# Patient Record
Sex: Female | Born: 1950 | Race: White | Hispanic: No | Marital: Married | Smoking: Never smoker
Health system: Southern US, Community
[De-identification: ages and names within clinical notes are randomized; demographics above are authoritative.]

## PROBLEM LIST (undated history)

## (undated) DIAGNOSIS — Z973 Presence of spectacles and contact lenses: Secondary | ICD-10-CM

## (undated) DIAGNOSIS — E785 Hyperlipidemia, unspecified: Secondary | ICD-10-CM

## (undated) HISTORY — PX: BELPHAROPTOSIS REPAIR: SHX369

## (undated) HISTORY — PX: TRIGGER FINGER RELEASE: SHX641

## (undated) HISTORY — PX: BREAST BIOPSY: SHX20

## (undated) HISTORY — PX: COLONOSCOPY: SHX174

## (undated) HISTORY — PX: ABDOMINOPLASTY: SUR9

---

## 1994-09-29 HISTORY — PX: CARPAL TUNNEL RELEASE: SHX101

## 2008-09-29 HISTORY — PX: ABDOMINAL HYSTERECTOMY: SHX81

## 2008-09-29 HISTORY — PX: BLADDER SUSPENSION: SHX72

## 2017-08-11 ENCOUNTER — Encounter: Payer: Self-pay | Admitting: *Deleted

## 2017-08-11 NOTE — Discharge Instructions (Signed)
[facility] °[facility] ° °POST OPERATIVE INSTRUCTIONS FOR DR. TROXLER AND DR. FOWLER °[facility] ° ° °1. Take your medication as prescribed.  Pain medication should be taken only as needed. ° °2. Keep the dressing clean, dry and intact. ° °3. Keep your foot elevated above the heart level for the first 48 hours. ° °4. Walking to the bathroom and brief periods of walking are acceptable, unless we have instructed you to be non-weight bearing. ° °5. Always wear your post-op shoe when walking.  Always use your crutches if you are to be non-weight bearing. ° °6. Do not take a shower. Baths are permissible as long as the foot is kept out of the water.  ° °7. Every hour you are awake:  °- Bend your knee 15 times. °- Flex foot 15 times °- Massage calf 15 times ° °8. Call [facility] ([phone]) if any of the following problems occur: °- You develop a temperature or fever. °- The bandage becomes saturated with blood. °- Medication does not stop your pain. °- Injury of the foot occurs. °- Any symptoms of infection including redness, odor, or red streaks running from wound. ° ° °General Anesthesia, Adult, Care After °These instructions provide you with information about caring for yourself after your procedure. Your health care provider may also give you more specific instructions. Your treatment has been planned according to current medical practices, but problems sometimes occur. Call your health care provider if you have any problems or questions after your procedure. °What can I expect after the procedure? °After the procedure, it is common to have: °· Vomiting. °· A sore throat. °· Mental slowness. ° °It is common to feel: °· Nauseous. °· Cold or shivery. °· Sleepy. °· Tired. °· Sore or achy, even in parts of your body where you did not have surgery. ° °Follow these instructions at home: °For at least 24 hours after the procedure: °· Do not: °? Participate in  activities where you could fall or become injured. °? Drive. °? Use heavy machinery. °? Drink alcohol. °? Take sleeping pills or medicines that cause drowsiness. °? Make important decisions or sign legal documents. °? Take care of children on your own. °· Rest. °Eating and drinking °· If you vomit, drink water, juice, or soup when you can drink without vomiting. °· Drink enough fluid to keep your urine clear or pale yellow. °· Make sure you have little or no nausea before eating solid foods. °· Follow the diet recommended by your health care provider. °General instructions °· Have a responsible adult stay with you until you are awake and alert. °· Return to your normal activities as told by your health care provider. Ask your health care provider what activities are safe for you. °· Take over-the-counter and prescription medicines only as told by your health care provider. °· If you smoke, do not smoke without supervision. °· Keep all follow-up visits as told by your health care provider. This is important. °Contact a health care provider if: °· You continue to have nausea or vomiting at home, and medicines are not helpful. °· You cannot drink fluids or start eating again. °· You cannot urinate after 8-12 hours. °· You develop a skin rash. °· You have fever. °· You have increasing redness at the site of your procedure. °Get help right away if: °· You have difficulty breathing. °· You have chest pain. °· You have unexpected bleeding. °· You feel that you   are having a life-threatening or urgent problem. °This information is not intended to replace advice given to you by your health care provider. Make sure you discuss any questions you have with your health care provider. °Document Released: 12/22/2000 Document Revised: 02/18/2016 Document Reviewed: 08/30/2015 °Elsevier Interactive Patient Education © 2018 [employer] ° °

## 2017-08-13 ENCOUNTER — Ambulatory Visit
Admission: RE | Admit: 2017-08-13 | Discharge: 2017-08-13 | Disposition: A | Discharge status: Home / Self Care | Payer: Managed Care, Other (non HMO) | Source: Ambulatory Visit | Attending: Podiatry | Admitting: Podiatry

## 2017-08-13 ENCOUNTER — Ambulatory Visit: Payer: Managed Care, Other (non HMO) | Admitting: Anesthesiology

## 2017-08-13 ENCOUNTER — Encounter
Admission: RE | Disposition: A | Discharge status: Home / Self Care | Payer: Self-pay | Source: Ambulatory Visit | Attending: Podiatry

## 2017-08-13 DIAGNOSIS — M2041 Other hammer toe(s) (acquired), right foot: Secondary | ICD-10-CM | POA: Insufficient documentation

## 2017-08-13 DIAGNOSIS — M2011 Hallux valgus (acquired), right foot: Secondary | ICD-10-CM | POA: Diagnosis not present

## 2017-08-13 HISTORY — DX: Presence of spectacles and contact lenses: Z97.3

## 2017-08-13 HISTORY — PX: WEIL OSTEOTOMY: SHX5044

## 2017-08-13 HISTORY — PX: HAMMER TOE SURGERY: SHX385

## 2017-08-13 HISTORY — PX: HALLUX VALGUS LAPIDUS: SHX6626

## 2017-08-13 HISTORY — DX: Hyperlipidemia, unspecified: E78.5

## 2017-08-13 SURGERY — CORRECTION, HAMMER TOE
Anesthesia: Regional | Laterality: Right | Wound class: Clean

## 2017-08-13 MED ORDER — FENTANYL CITRATE (PF) 100 MCG/2ML IJ SOLN
INTRAMUSCULAR | Status: DC | PRN
  Administered 2017-08-13: 50 ug via INTRAVENOUS
  Administered 2017-08-13: 25 ug via INTRAVENOUS

## 2017-08-13 MED ORDER — MIDAZOLAM HCL 2 MG/2ML IJ SOLN
INTRAMUSCULAR | Status: DC | PRN
  Administered 2017-08-13: 1 mg via INTRAVENOUS

## 2017-08-13 MED ORDER — ENOXAPARIN SODIUM 40 MG/0.4ML ~~LOC~~ SOLN: 40.0000 mg | SUBCUTANEOUS | 0 refills | Status: DC

## 2017-08-13 MED ORDER — LIDOCAINE HCL (CARDIAC) 20 MG/ML IV SOLN
INTRAVENOUS | Status: DC | PRN
  Administered 2017-08-13: 50 mg via INTRATRACHEAL

## 2017-08-13 MED ORDER — EPHEDRINE SULFATE 50 MG/ML IJ SOLN
INTRAMUSCULAR | Status: DC | PRN
  Administered 2017-08-13: 5 mg via INTRAVENOUS
  Administered 2017-08-13: 10 mg via INTRAVENOUS
  Administered 2017-08-13 (×4): 5 mg via INTRAVENOUS

## 2017-08-13 MED ORDER — ONDANSETRON HCL 4 MG/2ML IJ SOLN
INTRAMUSCULAR | Status: DC | PRN
  Administered 2017-08-13: 4 mg via INTRAVENOUS

## 2017-08-13 MED ORDER — LACTATED RINGERS IV SOLN
INTRAVENOUS | Status: DC
  Administered 2017-08-13: 07:00:00 via INTRAVENOUS

## 2017-08-13 MED ORDER — ROPIVACAINE HCL 5 MG/ML IJ SOLN
INTRAMUSCULAR | Status: DC | PRN
  Administered 2017-08-13: 25 mL via PERINEURAL
  Administered 2017-08-13: 12 mL via PERINEURAL

## 2017-08-13 MED ORDER — OXYCODONE-ACETAMINOPHEN 7.5-325 MG PO TABS: 1.0000 | ORAL_TABLET | ORAL | 0 refills | Status: DC | PRN

## 2017-08-13 MED ORDER — CLINDAMYCIN PHOSPHATE 600 MG/4ML IJ SOLN
600.0000 mg | Freq: Once | INTRAMUSCULAR | Status: AC
  Administered 2017-08-13: 600 mg via INTRAVENOUS

## 2017-08-13 MED ORDER — DEXAMETHASONE SODIUM PHOSPHATE 4 MG/ML IJ SOLN
INTRAMUSCULAR | Status: DC | PRN
  Administered 2017-08-13: 4 mg via INTRAVENOUS

## 2017-08-13 MED ORDER — PROPOFOL 10 MG/ML IV BOLUS
INTRAVENOUS | Status: DC | PRN
  Administered 2017-08-13: 110 mg via INTRAVENOUS

## 2017-08-13 SURGICAL SUPPLY — 65 items
BANDAGE ELASTIC 4 LF NS (GAUZE/BANDAGES/DRESSINGS) ×2 IMPLANT
BENZOIN TINCTURE PRP APPL 2/3 (GAUZE/BANDAGES/DRESSINGS) ×2 IMPLANT
BIT DRILL 1.7 LNG CANN (DRILL) ×2 IMPLANT
BIT DRILL 2 FENESTRATED (MISCELLANEOUS) ×1 IMPLANT
BIT DRILL 2.4X140 LONG SOLID (BIT) ×2 IMPLANT
BIT DRILL CANNULTD 2.6 X 130MM (DRILL) ×1 IMPLANT
BIT DRILL SOLID 2.0 X 110MM (DRILL) ×1 IMPLANT
BIT DRILL WIRE PASS (DRILL) ×1 IMPLANT
BIT DRILLL 2 FENESTRATED (MISCELLANEOUS) ×1
BLADE MINI RND TIP GREEN BEAV (BLADE) ×2 IMPLANT
BLADE OSC/SAGITTAL MD 5.5X18 (BLADE) ×2 IMPLANT
BLADE OSCILLATING/SAGITTAL (BLADE) ×1
BLADE SW THK.38XMED LNG THN (BLADE) ×1 IMPLANT
BNDG ESMARK 4X12 TAN STRL LF (GAUZE/BANDAGES/DRESSINGS) ×2 IMPLANT
BNDG GAUZE 4.5X4.1 6PLY STRL (MISCELLANEOUS) ×2 IMPLANT
BNDG STRETCH 4X75 STRL LF (GAUZE/BANDAGES/DRESSINGS) ×2 IMPLANT
CANISTER SUCT 1200ML W/VALVE (MISCELLANEOUS) ×2 IMPLANT
CNTRSNK DRL 2 HDLS SCR (MISCELLANEOUS) ×1 IMPLANT
COUNTERSICK 4.0 HEADED (MISCELLANEOUS) ×2
COUNTERSINK 2.0 (MISCELLANEOUS) ×1
COVER LIGHT HANDLE UNIVERSAL (MISCELLANEOUS) ×4 IMPLANT
COVER PIN YLW 0.028-062 (MISCELLANEOUS) ×4 IMPLANT
CUFF TOURN SGL QUICK 18 (TOURNIQUET CUFF) ×2 IMPLANT
DRAPE FLUOR MINI C-ARM 54X84 (DRAPES) ×2 IMPLANT
DRILL CANNULATED 2.6 X 130MM (DRILL) ×2
DRILL SOLID 2.0 X 110MM (DRILL) ×2
DRILL WIRE PASS (DRILL) ×2
DURAPREP 26ML APPLICATOR (WOUND CARE) ×2 IMPLANT
GAUZE PETRO XEROFOAM 1X8 (MISCELLANEOUS) ×2 IMPLANT
GAUZE SPONGE 4X4 12PLY STRL (GAUZE/BANDAGES/DRESSINGS) ×2 IMPLANT
GLOVE BIO SURGEON STRL SZ8 (GLOVE) ×2 IMPLANT
GOWN STRL REUS W/ TWL LRG LVL3 (GOWN DISPOSABLE) ×1 IMPLANT
GOWN STRL REUS W/ TWL XL LVL3 (GOWN DISPOSABLE) ×1 IMPLANT
GOWN STRL REUS W/TWL LRG LVL3 (GOWN DISPOSABLE) ×1
GOWN STRL REUS W/TWL XL LVL3 (GOWN DISPOSABLE) ×1
K-WIRE ×16 IMPLANT
K-WIRE DBL END TROCAR 6X.045 (WIRE) ×6
K-WIRE SNGL END 1.2X150 (MISCELLANEOUS) ×2
KIT ROOM TURNOVER OR (KITS) ×2 IMPLANT
KIT STAPLE ARCUS 8X8 STRL (Staple) ×2 IMPLANT
KWIRE DBL END TROCAR 6X.045 (WIRE) ×3 IMPLANT
KWIRE SNGL END 1.2X150 (MISCELLANEOUS) ×1 IMPLANT
NS IRRIG 500ML POUR BTL (IV SOLUTION) ×2 IMPLANT
PACK EXTREMITY ARMC (MISCELLANEOUS) ×2 IMPLANT
PAD GROUND ADULT SPLIT (MISCELLANEOUS) ×2 IMPLANT
PADDING CAST 4IN STRL (MISCELLANEOUS) ×2
PADDING CAST BLEND 4X4 STRL (MISCELLANEOUS) ×2 IMPLANT
PLATE STD RIGHT LAPIDUS (Plate) ×1 IMPLANT
RASP SM TEAR CROSS CUT (RASP) ×2 IMPLANT
SCREW 2.7 X 15 NON LOCK (Screw) ×2 IMPLANT
SCREW 4.0X28 STRL (Screw) ×2 IMPLANT
SCREW COUNTERSINK 4.0 HEADED (MISCELLANEOUS) ×1 IMPLANT
SCREW HEADLESS 2.0X11MM (Screw) ×4 IMPLANT
SCREW HEADLESS SHRT THRD 2X12 (Screw) ×4 IMPLANT
SCREW LOCK PLATE R3 2.7X16 ×6 IMPLANT
SCREW LOCK PLATE R3 2.7X20 ×2 IMPLANT
SPLINT CAST 1 STEP 5X30 WHT (MISCELLANEOUS) ×2 IMPLANT
SPONGE LAP 18X18 5 PK (GAUZE/BANDAGES/DRESSINGS) ×2 IMPLANT
STANDARD R LAPIDUS PLATE (Plate) ×2 IMPLANT
STOCKINETTE STRL 6IN 960660 (GAUZE/BANDAGES/DRESSINGS) ×2 IMPLANT
STRAP BODY AND KNEE 60X3 (MISCELLANEOUS) ×2 IMPLANT
STRIP CLOSURE SKIN 1/4X4 (GAUZE/BANDAGES/DRESSINGS) ×2 IMPLANT
SUT VIC AB 3-0 SH 27 (SUTURE) ×1
SUT VIC AB 3-0 SH 27X BRD (SUTURE) ×1 IMPLANT
SUT VIC AB 4-0 FS2 27 (SUTURE) ×6 IMPLANT

## 2017-08-13 NOTE — Anesthesia Procedure Notes (Signed)
Procedure Name: LMA Insertion Date/Time: 08/13/2017 7:35 AM Performed by: Maree KrabbeWarren, Latonia Conrow, CRNA Pre-anesthesia Checklist: Patient identified, Emergency Drugs available, Suction available, Timeout performed and Patient being monitored Patient Re-evaluated:Patient Re-evaluated prior to induction Oxygen Delivery Method: Circle system utilized Preoxygenation: Pre-oxygenation with 100% oxygen Induction Type: IV induction LMA: LMA inserted LMA Size: 3.0 Number of attempts: 1 Placement Confirmation: positive ETCO2 and breath sounds checked- equal and bilateral Tube secured with: Tape Dental Injury: Teeth and Oropharynx as per pre-operative assessment

## 2017-08-13 NOTE — Anesthesia Postprocedure Evaluation (Signed)
Anesthesia Post Note  Patient: Anna Dunlap  Procedure(s) Performed: HAMMER TOE CORRECTION RIGHT 2ND & 3RD (Right ) HALLUX VALGUS LAPIDUS (Right ) WEIL OSTEOTOMY (Right )  Patient location during evaluation: PACU Anesthesia Type: Regional and General Level of consciousness: awake Pain management: pain level controlled Vital Signs Assessment: post-procedure vital signs reviewed and stable Respiratory status: spontaneous breathing Cardiovascular status: blood pressure returned to baseline Postop Assessment: no headache Anesthetic complications: no    Beckey DowningEric Elam Ellis

## 2017-08-13 NOTE — Anesthesia Procedure Notes (Signed)
Anesthesia Regional Block: Popliteal block   Pre-Anesthetic Checklist: ,, timeout performed, Correct Patient, Correct Site, Correct Laterality, Correct Procedure, Correct Position, site marked, Risks and benefits discussed,  Surgical consent,  Pre-op evaluation,  At surgeon's request and post-op pain management  Laterality: Right  Prep: chloraprep       Needles:  Injection technique: Single-shot  Needle Type: Stimulator Needle - 40     Needle Length: 10cm  Needle Gauge: 21     Additional Needles:   Procedures:,,,, ultrasound used (permanent image in chart),,,,  Narrative:   Performed by: Personally

## 2017-08-13 NOTE — H&P (Signed)
H and P has been reviewed and no changes are noted.  

## 2017-08-13 NOTE — Anesthesia Procedure Notes (Signed)
Anesthesia Regional Block: Adductor canal block   Pre-Anesthetic Checklist: ,, timeout performed, Correct Patient, Correct Site, Correct Laterality, Correct Procedure, Correct Position, site marked, Risks and benefits discussed,  Surgical consent,  Pre-op evaluation,  At surgeon's request and post-op pain management  Laterality: Right  Prep: chloraprep       Needles:  Injection technique: Single-shot  Needle Type: Stimulator Needle - 40     Needle Length: 10cm  Needle Gauge: 21     Additional Needles:   Procedures:,,,, ultrasound used (permanent image in chart),,,,  Narrative:

## 2017-08-13 NOTE — Op Note (Signed)
Operative note   Surgeon: Dr. Albertine Patricia, DPM.    Assistant: None    Preop diagnosis: 1. 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 Plantar displaced second metatarsal right foot 3. 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 Plantar displaced third metatarsal right foot 5. Hammertoe deformity third toe right foot    Postop diagnosis: Same    Procedure:   1. Hallux valgus correction with Lapidus arthrodesis to the first [facility] J along with an Akin osteotomy of the proximal phalanx.   2. Osteotomy second metatarsal screw fixation   3. Osteotomy third metatarsal screw fixation  4. Hammertoe repair with flexor tendon transfer and arthrodesis of the PIP joint second toe right foot 5. Hammertoe repair with arthrodesis of the PIP joint third toe right foot     EBL: 20 mL    Anesthesia:general delivered by the anesthesia team along with a popliteal block and saphenous nerve block delivered by the anesthesia team    Hemostasis: Ankle tourniquet 225 mL mercury pressure. This was dropped at approximately the 90 minute point for 10 minutes and then once again after about 30 more minutes was dropped for 10 more minutes.    Specimen: None    Complications: None    Operative indications: Chronic pain resistant and unresponsive to conservative care on the right foot    Procedure:  Patient was brought into the OR and placed on the operating table in thesupine position. After anesthesia was obtained theright lower extremity was prepped and draped in usual sterile fashion.  Operative Report: This time to his directed to the first metatarsal cuneiform joint and also the first metatarsophalangeal joint . Initially the first MTP joint was dissected with dorsolinear incision deepened sharp blunt dissection bleeders clamped and bovied as required. Tissue was identified and incised longitudinally reflected dorsally and plantar aspects  metatarsal head. A large medial dorsal medial some bone was noticed resected and rasped smoothly. This point attention was directed to the lateral aspect joint were Nidek 10 release fibular sesamoidal ligament release and lateral capsulotomy were performed. This area was copiously irrigated  Now attention was directed to the first met cuneiform joint which was dissected dorsally medially. This point a [employer] cut guide was placed and the areas checked FluoroScan and then once is in good position we pinned it and then cuts were made to the cartilage is an 8 correction. Cartilage was removed from both the first metatarsal base and the distal portion of the medial cuneiform and good raw bone was noted. This point the areas were drilled with a 1.5 drill bit on both aspects of the cartilage to fenestrate the region and promote better fusion. This point correction was placed onto the first metatarsal both in the sagittal plane transverse plane as well as the frontal plane. A guidewire was used to rotate the bone in the frontal plane and the area was fixated temporarily with a 0.062 K wire. There is checked FluoroScan good position and correction were noted. This point the area was prepared for plate and screw fixation. The [employer] set was utilized and a 40 screw was placed across the area holding area stable. There is checked forcing good position was noted as well as good correction. The screw was from dorsal lateral plantar medial. This point the plate was secured utilizing 2.7 locking screws on the medial plate. This time to his directed proximal phalanx where a wedge of bone was  removed from the medial cortex apex lateral base medial. This was feathered and closed and fixated with a 8 mm staple. This checked forcing good position correction were noted. This time the areas were copiously irrigated and capsular and periosteal tissues and closed with 4 Vicryl in continuous stitch deep superficial fascial layers were  closed lengthy incision both proximally and distally with 4 Vicryl in continuous stitch skin was closed with 4 Vicryl subcuticular fashion.    Procedure 2:  Attention was now directed to the second metatarsal phalangeal joint where a curvilinear incision made over the MTP joint and extended over the toe in a linear fashion. The incision was deepened sharp blunt dissection bleeders clamped and bovied as required. Extension was notified and extensor hood was notified and released. The extensor was distracted laterally and linear incision made through the periosteum and capsule tissue of the second metatarsal head and phalangeal joint. This point an osteotomy was performed at the osteochondral junction from dorsal distal plantar proximal the head of metatarsals and transposed to a more proximal position but a second layer of bone was removed to allow for shortening without plantar flexion. There is temporally fixated and a FluoroScan image was achieved and good position was noted. This point to 2.0 headless screws from [employer] many months or set was placed across the osteotomy. There is checked FluoroScan good position and correction were noted. There is an capsulae irrigated and cleaned out.  Procedure #3: This time attention directed to the second toe PIP joint area where the incision was artery achieved over that region. Extension was notified and incised transversely reflected proximally. The articular cartilage of the proximal phalanx and also the middle phalanx base. At this point the long flexor tendon was freed in that region and split and sutured to the midshaft region the proximal phalanx to accomplish a flexor tendon transfer. Once this was established and stable area was irrigated and a 0.5 K wires drilled to the middle distal phalanges and retrograded in the proximal phalanx once this was a well opposed the toe was plantarflexed and the K wire was carried through the MTP joint at shaft of the second  metatarsal. There is checked FluoroScan excellent position and correction were noted. This point the extensor tendon over the toe was closed with 3 simple ruptured sutures of 4 Vicryl. Deep superficial fascial layers and closed along the course of the incision to the second MTP joint and second toe with 4 Vicryl in continuous stitch and skin was closed with 4 Vicryl subcuticular fashion.  Procedure #4: This time to his directed to the third metatarsal phalangeal joint where similar osteotomy was performed on the metatarsal as that compared to the second metatarsal.  Procedure #5: This time to his directed to the third toe of the right foot where an arthrodesis was performed similar to that of the second toe but no flexor tendon transfer was accomplished here.   Closure of the wounds incision margins were identical.  This time a sterile compressive dressings placed across wound consisting of Steri-Strips Xeroform gauze 4 x 4's Kling and Kerlix. Tourniquet was released for the final time and complete vascularity seen to return all digits of the right foot a posterior splint was placed on the right foot leg in the OR. Patient remain nonweightbearing. Prescriptions were given for both oxycodone for pain control and Lovenox injections which she is to start at about file [facility] afternoon. She's continue this for 5 days.  Patient tolerated the procedure and anesthesia well.  Was transported from the OR to the PACU with all vital signs stable and vascular status intact. To be discharged per routine protocol.  Will follow up in approximately 1 week in the outpatient clinic.

## 2017-08-13 NOTE — Progress Notes (Signed)
Assisted Dr. Epifanio LeschesEhieli with right, ultrasound guided, popliteal/saphenous block. Side rails up, monitors on throughout procedure. See vital signs in flow sheet. Tolerated Procedure well.

## 2017-08-13 NOTE — Anesthesia Preprocedure Evaluation (Addendum)
Anesthesia Evaluation  Patient identified by MRN, date of birth, ID band Patient awake    Reviewed: Allergy & Precautions, NPO status , Patient's Chart, lab work & pertinent test results, reviewed documented beta blocker date and time   Airway Mallampati: II  TM Distance: >3 FB Neck ROM: Full    Dental no notable dental hx.    Pulmonary neg pulmonary ROS,    Pulmonary exam normal breath sounds clear to auscultation- rhonchi       Cardiovascular negative cardio ROS Normal cardiovascular exam Rhythm:Regular Rate:Normal     Neuro/Psych negative neurological ROS  negative psych ROS   GI/Hepatic negative GI ROS, Neg liver ROS,   Endo/Other  negative endocrine ROS  Renal/GU negative Renal ROS  negative genitourinary   Musculoskeletal  (+) Arthritis ,   Abdominal Normal abdominal exam  (+)  Abdomen: soft.    Peds  Hematology negative hematology ROS (+)   Anesthesia Other Findings   Reproductive/Obstetrics                            Anesthesia Physical Anesthesia Plan  ASA: I  Anesthesia Plan: General and Regional   Post-op Pain Management: GA combined w/ Regional for post-op pain   Induction: Intravenous  PONV Risk Score and Plan:   Airway Management Planned: LMA  Additional Equipment: None  Intra-op Plan:   Post-operative Plan:   Informed Consent: I have reviewed the patients History and Physical, chart, labs and discussed the procedure including the risks, benefits and alternatives for the proposed anesthesia with the patient or authorized representative who has indicated his/her understanding and acceptance.     Plan Discussed with: CRNA, Anesthesiologist and Surgeon  Anesthesia Plan Comments:        Anesthesia Quick Evaluation

## 2017-08-13 NOTE — Transfer of Care (Signed)
Immediate Anesthesia Transfer of Care Note  Patient: Anna Dunlap  Procedure(s) Performed: HAMMER TOE CORRECTION RIGHT 2ND & 3RD (Right ) HALLUX VALGUS LAPIDUS (Right ) WEIL OSTEOTOMY (Right )  Patient Location: PACU  Anesthesia Type: General, Regional  Level of Consciousness: awake, alert  and patient cooperative  Airway and Oxygen Therapy: Patient Spontanous Breathing and Patient connected to supplemental oxygen  Post-op Assessment: Post-op Vital signs reviewed, Patient's Cardiovascular Status Stable, Respiratory Function Stable, Patent Airway and No signs of Nausea or vomiting  Post-op Vital Signs: Reviewed and stable  Complications: No apparent anesthesia complications

## 2017-08-14 ENCOUNTER — Encounter: Payer: Self-pay | Admitting: Podiatry

## 2017-10-26 ENCOUNTER — Other Ambulatory Visit: Payer: Self-pay | Admitting: *Deleted

## 2017-10-26 DIAGNOSIS — Z8249 Family history of ischemic heart disease and other diseases of the circulatory system: Secondary | ICD-10-CM

## 2017-11-06 ENCOUNTER — Ambulatory Visit
Admission: RE | Admit: 2017-11-06 | Discharge: 2017-11-06 | Disposition: A | Discharge status: Home / Self Care | Payer: Self-pay | Source: Ambulatory Visit | Attending: Cardiovascular Disease | Admitting: Cardiovascular Disease

## 2017-11-06 DIAGNOSIS — Z8249 Family history of ischemic heart disease and other diseases of the circulatory system: Secondary | ICD-10-CM

## 2017-11-30 ENCOUNTER — Other Ambulatory Visit: Payer: Self-pay | Admitting: Family Medicine

## 2017-11-30 DIAGNOSIS — R1011 Right upper quadrant pain: Secondary | ICD-10-CM

## 2017-12-01 ENCOUNTER — Ambulatory Visit
Admission: RE | Admit: 2017-12-01 | Discharge: 2017-12-01 | Disposition: A | Discharge status: Home / Self Care | Payer: Managed Care, Other (non HMO) | Source: Ambulatory Visit | Attending: Family Medicine | Admitting: Family Medicine

## 2017-12-01 DIAGNOSIS — R1011 Right upper quadrant pain: Secondary | ICD-10-CM | POA: Insufficient documentation

## 2017-12-01 DIAGNOSIS — K8020 Calculus of gallbladder without cholecystitis without obstruction: Secondary | ICD-10-CM | POA: Diagnosis not present

## 2017-12-01 DIAGNOSIS — R932 Abnormal findings on diagnostic imaging of liver and biliary tract: Secondary | ICD-10-CM | POA: Insufficient documentation

## 2017-12-22 ENCOUNTER — Other Ambulatory Visit: Payer: Self-pay | Admitting: General Surgery

## 2017-12-31 NOTE — Pre-Procedure Instructions (Signed)
Christin BachGrace S Macomber  12/31/2017      [facility] [place] [id number] [address] [place] [address] Phone: [phone] Fax: [phone]    Your procedure is scheduled on April 16  [facility].M[facility]  Call this number if you have problems the morning of surgery:  [phone]   Remember:  Do not eat food or drink liquids after midnight.  Take these medicines the morning of surgery with A SIP OF WATER none   Stop taking aspirin, BC's, Goody's, Herbal medications, Fish Oil, Aleve, Ibuprofen, Advil, Motrin, vitamins   Do not wear jewelry, make-up or nail polish.  Do not wear lotions, powders, or perfumes, or deodorant.  Do not shave 48 hours prior to surgery.  Men may shave face and neck.  Do not bring valuables to the hospital.  [facility] is not responsible for any belongings or valuables.  Contacts, dentures or bridgework may not be worn into surgery.  Leave your suitcase in the car.  After surgery it may be brought to your room.  For patients admitted to the hospital, discharge time will be determined by your treatment team.  Patients discharged the day of surgery will not be allowed to drive home.    Special instructions:  [facility]- Preparing For Surgery  Before surgery, you can play an important role. Because skin is not sterile, your skin needs to be as free of germs as possible. You can reduce the number of germs on your skin by washing with CHG (chlorahexidine gluconate) Soap before surgery.  CHG is an antiseptic cleaner which kills germs and bonds with the skin to continue killing germs even after washing.  Please do not use if you have an allergy to CHG or antibacterial soaps. If your skin becomes reddened/irritated stop using the CHG.  Do not shave (including legs and underarms) for at least 48 hours prior to first CHG shower. It is OK to shave your face.  Please follow  these instructions carefully.   1. Shower the NIGHT BEFORE SURGERY and the MORNING OF SURGERY with CHG.   2. If you chose to wash your hair, wash your hair first as usual with your normal shampoo.  3. After you shampoo, rinse your hair and body thoroughly to remove the shampoo.  4. Use CHG as you would any other liquid soap. You can apply CHG directly to the skin and wash gently with a scrungie or a clean washcloth.   5. Apply the CHG Soap to your body ONLY FROM THE NECK DOWN.  Do not use on open wounds or open sores. Avoid contact with your eyes, ears, mouth and genitals (private parts). Wash Face and genitals (private parts)  with your normal soap.  6. Wash thoroughly, paying special attention to the area where your surgery will be performed.  7. Thoroughly rinse your body with warm water from the neck down.  8. DO NOT shower/wash with your normal soap after using and rinsing off the CHG Soap.  9. Pat yourself dry with a CLEAN TOWEL.  10. Wear CLEAN PAJAMAS to bed the night before surgery, wear comfortable clothes the morning of surgery  11. Place CLEAN SHEETS on your bed the night of your first shower and DO NOT SLEEP WITH PETS.    Day of Surgery: Do not apply any deodorants/lotions. Please wear clean clothes to the hospital/surgery center.  Please read over the following fact sheets that you were given. Pain Booklet, Coughing and Deep Breathing, MRSA Information and Surgical Site Infection Prevention

## 2018-01-01 ENCOUNTER — Other Ambulatory Visit: Payer: Self-pay

## 2018-01-01 ENCOUNTER — Encounter (HOSPITAL_COMMUNITY)
Admission: RE | Admit: 2018-01-01 | Discharge: 2018-01-01 | Disposition: A | Discharge status: Home / Self Care | Payer: Managed Care, Other (non HMO) | Source: Ambulatory Visit | Attending: General Surgery | Admitting: General Surgery

## 2018-01-01 ENCOUNTER — Encounter (HOSPITAL_COMMUNITY): Payer: Self-pay

## 2018-01-01 DIAGNOSIS — Z01812 Encounter for preprocedural laboratory examination: Secondary | ICD-10-CM | POA: Insufficient documentation

## 2018-01-01 LAB — CBC
HEMATOCRIT: 37.8 % (ref 36.0–46.0)
HEMOGLOBIN: 12.1 g/dL (ref 12.0–15.0)
MCH: 29.7 pg (ref 26.0–34.0)
MCHC: 32 g/dL (ref 30.0–36.0)
MCV: 92.6 fL (ref 78.0–100.0)
Platelets: 426 10*3/uL — ABNORMAL HIGH (ref 150–400)
RBC: 4.08 MIL/uL (ref 3.87–5.11)
RDW: 14.2 % (ref 11.5–15.5)
WBC: 5.7 10*3/uL (ref 4.0–10.5)

## 2018-01-01 NOTE — Progress Notes (Addendum)
PCP is Dr. Arne ClevelandLisa Mariah Dunlap Denies seeing a cardiologist Denies chest pain, cough, or fever. Denies ever having a card cath, stress test, or echo Request sent for EKG tracing From Dr. Bradly BienenstockMartinez office [facility] [place]

## 2018-01-08 NOTE — Progress Notes (Signed)
[facility] [place] [facility] requesting [facility]KG and last office note. Re-faxing release of information.

## 2018-01-11 NOTE — Progress Notes (Signed)
Re- requested last Ekg tracing and office visit (note) from [facility].

## 2018-01-11 NOTE — Anesthesia Preprocedure Evaluation (Addendum)
Anesthesia Evaluation  Patient identified by MRN, date of birth, ID band Patient awake    Reviewed: Allergy & Precautions, NPO status , Patient's Chart, lab work & pertinent test results  Airway Mallampati: II  TM Distance: >3 FB Neck ROM: Full    Dental no notable dental hx.    Pulmonary neg pulmonary ROS,    Pulmonary exam normal breath sounds clear to auscultation       Cardiovascular Exercise Tolerance: Good negative cardio ROS Normal cardiovascular exam Rhythm:Regular Rate:Normal     Neuro/Psych negative neurological ROS  negative psych ROS   GI/Hepatic negative GI ROS, Neg liver ROS,   Endo/Other  negative endocrine ROS  Renal/GU negative Renal ROS     Musculoskeletal   Abdominal   Peds negative pediatric ROS (+)  Hematology   Anesthesia Other Findings   Reproductive/Obstetrics                            Lab Results  Component Value Date   WBC 5.7 01/01/2018   HGB 12.1 01/01/2018   HCT 37.8 01/01/2018   MCV 92.6 01/01/2018   PLT 426 (H) 01/01/2018    Anesthesia Physical Anesthesia Plan  ASA: I  Anesthesia Plan: General   Post-op Pain Management:    Induction: Intravenous  PONV Risk Score and Plan: Treatment may vary due to age or medical condition, Ondansetron and Dexamethasone  Airway Management Planned: Oral ETT  Additional Equipment:   Intra-op Plan:   Post-operative Plan:   Informed Consent: I have reviewed the patients History and Physical, chart, labs and discussed the procedure including the risks, benefits and alternatives for the proposed anesthesia with the patient or authorized representative who has indicated his/her understanding and acceptance.   Dental advisory given  Plan Discussed with:   Anesthesia Plan Comments:         Anesthesia Quick Evaluation

## 2018-01-12 ENCOUNTER — Ambulatory Visit (HOSPITAL_COMMUNITY)
Admission: RE | Admit: 2018-01-12 | Discharge: 2018-01-12 | Disposition: A | Discharge status: Home / Self Care | Payer: Managed Care, Other (non HMO) | Source: Ambulatory Visit | Attending: General Surgery | Admitting: General Surgery

## 2018-01-12 ENCOUNTER — Encounter (HOSPITAL_COMMUNITY)
Admission: RE | Disposition: A | Discharge status: Home / Self Care | Payer: Self-pay | Source: Ambulatory Visit | Attending: General Surgery

## 2018-01-12 ENCOUNTER — Ambulatory Visit (HOSPITAL_COMMUNITY): Payer: Managed Care, Other (non HMO) | Admitting: Anesthesiology

## 2018-01-12 ENCOUNTER — Encounter (HOSPITAL_COMMUNITY): Payer: Self-pay | Admitting: Certified Registered"

## 2018-01-12 DIAGNOSIS — K8010 Calculus of gallbladder with chronic cholecystitis without obstruction: Secondary | ICD-10-CM | POA: Diagnosis not present

## 2018-01-12 HISTORY — PX: CHOLECYSTECTOMY: SHX55

## 2018-01-12 SURGERY — LAPAROSCOPIC CHOLECYSTECTOMY
Anesthesia: General | Site: Abdomen

## 2018-01-12 MED ORDER — BUPIVACAINE-EPINEPHRINE 0.25% -1:200000 IJ SOLN
INTRAMUSCULAR | Status: DC | PRN
  Administered 2018-01-12: 11 mL

## 2018-01-12 MED ORDER — MIDAZOLAM HCL 2 MG/2ML IJ SOLN
INTRAMUSCULAR | Status: DC | PRN
  Administered 2018-01-12: 2 mg via INTRAVENOUS

## 2018-01-12 MED ORDER — HEMOSTATIC AGENTS (NO CHARGE) OPTIME
TOPICAL | Status: DC | PRN
  Administered 2018-01-12: 1 via TOPICAL

## 2018-01-12 MED ORDER — ONDANSETRON HCL 4 MG/2ML IJ SOLN
INTRAMUSCULAR | Status: AC
  Filled 2018-01-12: qty 2

## 2018-01-12 MED ORDER — GABAPENTIN 300 MG PO CAPS
300.0000 mg | ORAL_CAPSULE | ORAL | Status: AC
  Administered 2018-01-12: 300 mg via ORAL
  Filled 2018-01-12: qty 1

## 2018-01-12 MED ORDER — MEPERIDINE HCL 50 MG/ML IJ SOLN: 6.2500 mg | INTRAMUSCULAR | Status: DC | PRN

## 2018-01-12 MED ORDER — ROCURONIUM BROMIDE 10 MG/ML (PF) SYRINGE
PREFILLED_SYRINGE | INTRAVENOUS | Status: DC | PRN
  Administered 2018-01-12: 35 mg via INTRAVENOUS

## 2018-01-12 MED ORDER — FENTANYL CITRATE (PF) 100 MCG/2ML IJ SOLN
INTRAMUSCULAR | Status: DC | PRN
  Administered 2018-01-12: 100 ug via INTRAVENOUS
  Administered 2018-01-12: 50 ug via INTRAVENOUS

## 2018-01-12 MED ORDER — DEXMEDETOMIDINE HCL IN NACL 200 MCG/50ML IV SOLN
INTRAVENOUS | Status: AC
  Filled 2018-01-12: qty 50

## 2018-01-12 MED ORDER — LACTATED RINGERS IV SOLN
INTRAVENOUS | Status: DC | PRN
  Administered 2018-01-12: 07:00:00 via INTRAVENOUS

## 2018-01-12 MED ORDER — SODIUM CHLORIDE 0.9 % IR SOLN
Status: DC | PRN
  Administered 2018-01-12: 1000 mL

## 2018-01-12 MED ORDER — PROMETHAZINE HCL 25 MG/ML IJ SOLN: 6.2500 mg | INTRAMUSCULAR | Status: DC | PRN

## 2018-01-12 MED ORDER — ONDANSETRON HCL 4 MG/2ML IJ SOLN
INTRAMUSCULAR | Status: DC | PRN
  Administered 2018-01-12: 4 mg via INTRAVENOUS

## 2018-01-12 MED ORDER — HYDROMORPHONE HCL 2 MG/ML IJ SOLN
INTRAMUSCULAR | Status: AC
  Filled 2018-01-12: qty 1

## 2018-01-12 MED ORDER — ACETAMINOPHEN 500 MG PO TABS
1000.0000 mg | ORAL_TABLET | ORAL | Status: AC
  Administered 2018-01-12: 1000 mg via ORAL
  Filled 2018-01-12: qty 2

## 2018-01-12 MED ORDER — FENTANYL CITRATE (PF) 250 MCG/5ML IJ SOLN
INTRAMUSCULAR | Status: AC
  Filled 2018-01-12: qty 5

## 2018-01-12 MED ORDER — CIPROFLOXACIN IN D5W 400 MG/200ML IV SOLN
400.0000 mg | INTRAVENOUS | Status: AC
  Administered 2018-01-12: 400 mg via INTRAVENOUS
  Filled 2018-01-12: qty 200

## 2018-01-12 MED ORDER — DEXAMETHASONE SODIUM PHOSPHATE 10 MG/ML IJ SOLN
INTRAMUSCULAR | Status: DC | PRN
Start: 2018-01-12 — End: 2018-01-12
  Administered 2018-01-12: 10 mg via INTRAVENOUS

## 2018-01-12 MED ORDER — KETOROLAC TROMETHAMINE 30 MG/ML IJ SOLN
30.0000 mg | Freq: Once | INTRAMUSCULAR | Status: AC
  Administered 2018-01-12: 30 mg via INTRAVENOUS

## 2018-01-12 MED ORDER — EPHEDRINE 5 MG/ML INJ
INTRAVENOUS | Status: AC
  Filled 2018-01-12: qty 10

## 2018-01-12 MED ORDER — DEXAMETHASONE SODIUM PHOSPHATE 10 MG/ML IJ SOLN
INTRAMUSCULAR | Status: AC
  Filled 2018-01-12: qty 1

## 2018-01-12 MED ORDER — LIDOCAINE 2% (20 MG/ML) 5 ML SYRINGE
INTRAMUSCULAR | Status: AC
  Filled 2018-01-12: qty 5

## 2018-01-12 MED ORDER — MIDAZOLAM HCL 2 MG/2ML IJ SOLN
INTRAMUSCULAR | Status: AC
  Filled 2018-01-12: qty 2

## 2018-01-12 MED ORDER — ACETAMINOPHEN 10 MG/ML IV SOLN: 1000.0000 mg | Freq: Once | INTRAVENOUS | Status: DC | PRN

## 2018-01-12 MED ORDER — HYDROCODONE-ACETAMINOPHEN 7.5-325 MG PO TABS: 1.0000 | ORAL_TABLET | Freq: Once | ORAL | Status: DC | PRN

## 2018-01-12 MED ORDER — SUGAMMADEX SODIUM 200 MG/2ML IV SOLN
INTRAVENOUS | Status: DC | PRN
  Administered 2018-01-12: 140 mg via INTRAVENOUS

## 2018-01-12 MED ORDER — SUGAMMADEX SODIUM 200 MG/2ML IV SOLN
INTRAVENOUS | Status: AC
  Filled 2018-01-12: qty 2

## 2018-01-12 MED ORDER — PROPOFOL 10 MG/ML IV BOLUS
INTRAVENOUS | Status: DC | PRN
  Administered 2018-01-12: 120 mg via INTRAVENOUS

## 2018-01-12 MED ORDER — PROPOFOL 10 MG/ML IV BOLUS
INTRAVENOUS | Status: AC
  Filled 2018-01-12: qty 20

## 2018-01-12 MED ORDER — 0.9 % SODIUM CHLORIDE (POUR BTL) OPTIME
TOPICAL | Status: DC | PRN
  Administered 2018-01-12: 1000 mL

## 2018-01-12 MED ORDER — BUPIVACAINE-EPINEPHRINE (PF) 0.25% -1:200000 IJ SOLN
INTRAMUSCULAR | Status: AC
  Filled 2018-01-12: qty 30

## 2018-01-12 MED ORDER — DEXMEDETOMIDINE HCL IN NACL 200 MCG/50ML IV SOLN
INTRAVENOUS | Status: DC | PRN
  Administered 2018-01-12: 34 ug via INTRAVENOUS

## 2018-01-12 MED ORDER — LIDOCAINE 2% (20 MG/ML) 5 ML SYRINGE
INTRAMUSCULAR | Status: DC | PRN
  Administered 2018-01-12: 60 mg via INTRAVENOUS

## 2018-01-12 MED ORDER — KETOROLAC TROMETHAMINE 30 MG/ML IJ SOLN
INTRAMUSCULAR | Status: AC
  Filled 2018-01-12: qty 1

## 2018-01-12 MED ORDER — HYDROMORPHONE HCL 2 MG/ML IJ SOLN
0.2500 mg | INTRAMUSCULAR | Status: DC | PRN
  Administered 2018-01-12: 0.5 mg via INTRAVENOUS

## 2018-01-12 MED ORDER — EPHEDRINE SULFATE-NACL 50-0.9 MG/10ML-% IV SOSY
PREFILLED_SYRINGE | INTRAVENOUS | Status: DC | PRN
Start: 2018-01-12 — End: 2018-01-12
  Administered 2018-01-12 (×2): 10 mg via INTRAVENOUS

## 2018-01-12 MED ORDER — ROCURONIUM BROMIDE 10 MG/ML (PF) SYRINGE
PREFILLED_SYRINGE | INTRAVENOUS | Status: AC
  Filled 2018-01-12: qty 5

## 2018-01-12 MED ORDER — OXYCODONE-ACETAMINOPHEN 10-325 MG PO TABS: 1.0000 | ORAL_TABLET | Freq: Four times a day (QID) | ORAL | 0 refills | Status: AC | PRN

## 2018-01-12 SURGICAL SUPPLY — 40 items
APPLIER CLIP 5 13 M/L LIGAMAX5 (MISCELLANEOUS) ×2
BLADE CLIPPER SURG (BLADE) IMPLANT
CANISTER SUCT 3000ML PPV (MISCELLANEOUS) ×2 IMPLANT
CHLORAPREP W/TINT 26ML (MISCELLANEOUS) ×2 IMPLANT
CLIP APPLIE 5 13 M/L LIGAMAX5 (MISCELLANEOUS) ×1 IMPLANT
CLOSURE STERI-STRIP 1/4X4 (GAUZE/BANDAGES/DRESSINGS) ×2 IMPLANT
COVER SURGICAL LIGHT HANDLE (MISCELLANEOUS) ×2 IMPLANT
DERMABOND ADHESIVE PROPEN (GAUZE/BANDAGES/DRESSINGS) ×1
DERMABOND ADVANCED (GAUZE/BANDAGES/DRESSINGS) ×2
DERMABOND ADVANCED .7 DNX12 (GAUZE/BANDAGES/DRESSINGS) ×2 IMPLANT
DERMABOND ADVANCED .7 DNX6 (GAUZE/BANDAGES/DRESSINGS) ×1 IMPLANT
DEVICE TROCAR PUNCTURE CLOSURE (ENDOMECHANICALS) ×2 IMPLANT
ELECT REM PT RETURN 9FT ADLT (ELECTROSURGICAL) ×2
ELECTRODE REM PT RTRN 9FT ADLT (ELECTROSURGICAL) ×1 IMPLANT
GLOVE BIO SURGEON STRL SZ7 (GLOVE) ×2 IMPLANT
GLOVE BIOGEL PI IND STRL 7.5 (GLOVE) ×1 IMPLANT
GLOVE BIOGEL PI INDICATOR 7.5 (GLOVE) ×1
GOWN STRL REUS W/ TWL LRG LVL3 (GOWN DISPOSABLE) ×3 IMPLANT
GOWN STRL REUS W/TWL LRG LVL3 (GOWN DISPOSABLE) ×3
HEMOSTAT SNOW SURGICEL 2X4 (HEMOSTASIS) ×2 IMPLANT
KIT BASIN OR (CUSTOM PROCEDURE TRAY) ×2 IMPLANT
KIT TURNOVER KIT B (KITS) ×2 IMPLANT
NS IRRIG 1000ML POUR BTL (IV SOLUTION) ×2 IMPLANT
PAD ARMBOARD 7.5X6 YLW CONV (MISCELLANEOUS) ×2 IMPLANT
POUCH RETRIEVAL ECOSAC 10 (ENDOMECHANICALS) ×1 IMPLANT
POUCH RETRIEVAL ECOSAC 10MM (ENDOMECHANICALS) ×1
SCISSORS LAP 5X35 DISP (ENDOMECHANICALS) ×2 IMPLANT
SET IRRIG TUBING LAPAROSCOPIC (IRRIGATION / IRRIGATOR) ×2 IMPLANT
SLEEVE ENDOPATH XCEL 5M (ENDOMECHANICALS) ×4 IMPLANT
SPECIMEN JAR SMALL (MISCELLANEOUS) ×2 IMPLANT
STRIP CLOSURE SKIN 1/2X4 (GAUZE/BANDAGES/DRESSINGS) ×2 IMPLANT
SUT MNCRL AB 4-0 PS2 18 (SUTURE) ×2 IMPLANT
SUT VICRYL 0 UR6 27IN ABS (SUTURE) ×2 IMPLANT
TOWEL OR 17X24 6PK STRL BLUE (TOWEL DISPOSABLE) ×2 IMPLANT
TOWEL OR 17X26 10 PK STRL BLUE (TOWEL DISPOSABLE) ×2 IMPLANT
TRAY LAPAROSCOPIC MC (CUSTOM PROCEDURE TRAY) ×2 IMPLANT
TROCAR XCEL BLUNT TIP 100MML (ENDOMECHANICALS) ×2 IMPLANT
TROCAR XCEL NON-BLD 5MMX100MML (ENDOMECHANICALS) ×2 IMPLANT
TUBING INSUFFLATION (TUBING) ×2 IMPLANT
WATER STERILE IRR 1000ML POUR (IV SOLUTION) ×2 IMPLANT

## 2018-01-12 NOTE — Anesthesia Postprocedure Evaluation (Signed)
Anesthesia Post Note  Patient: Anna Dunlap  Procedure(s) Performed: LAPAROSCOPIC CHOLECYSTECTOMY (N/A Abdomen)     Patient location during evaluation: PACU Anesthesia Type: General Level of consciousness: awake and alert Pain management: pain level controlled Vital Signs Assessment: post-procedure vital signs reviewed and stable Respiratory status: spontaneous breathing, nonlabored ventilation, respiratory function stable and patient connected to nasal cannula oxygen Cardiovascular status: blood pressure returned to baseline and stable Postop Assessment: no apparent nausea or vomiting Anesthetic complications: no    Last Vitals:  Vitals:   01/12/18 1015 01/12/18 1039  BP: 120/75 129/78  Pulse: 60 (!) 58  Resp: 14 16  Temp: 36.6 C   SpO2: 98% 94%    Last Pain:  Vitals:   01/12/18 1039  TempSrc:   PainSc: 3                  Trevor IhaStephen A Houser

## 2018-01-12 NOTE — Anesthesia Procedure Notes (Signed)

## 2018-01-12 NOTE — Op Note (Signed)
Preoperative diagnosis:chronic cholecystitis Postoperative diagnosis:same as above Procedure: Laparoscopic cholecystectomy Surgeon: Dr. Harden MoMatt Dennie Dunlap Anesthesia: Gen. Specimens: gb to pathology Estimated blood loss:minimal Complications: None Drains: none Sponge count was correct at completion Disposition to recovery stable  Indications: This is [id number] who has gallstones and ultrasoundand appears to have biliary colic. We discussed going to the or for lap chole.  Procedure: After informed consent was obtained the patient was taken to the operating room.She was givenantibiotics. SCDs were in place.She was placed undergeneral anesthesia without complication. Herabdomen was prepped and draped in the standard sterile surgical fashion. A surgical timeout was then performed.  I infiltrated marcainebelow the umbilicus. I went below due to prior abdominoplasty. I then incised the fascia.I placed a 0 vicryl pursestring suture. I then inserted a hasson trocar andI insufflated the abdomen to 15 mm Hg pressure. There was no entry injury. An additionalruq, right mid abdomenand epigastric trocar were both placed under direct vision (5 mm). The gallbladder had evidence ofchroniccholecystitis. There were some adhesions to the omentum that I took down. I grasped the gallbladder and retracted it cephalad and lateral.EventuallyI was able to identify the critical view of safety.I then clipped the duct distally once and proximally twice. The duct was viable.I divided the duct. The clips traversed the duct. I then clipped and divided the cystic artery.I then removed the gallbladder from the liver bed. I placed the gallbladderin a bag and removed from the umbilicus. I obtained hemostasis. I then removed the hasson trocar, tied down the pursestringand closed this also with several2-0 vicrylsutures using the endoclosedevice to close the trocar site.I then removed the remaining  trocars and these were closed with 4-0 Monocryl and glue. She tolerated this well be transferred to the recovery room

## 2018-01-12 NOTE — Interval H&P Note (Signed)
History and Physical Interval Note:  01/12/2018 7:13 AM  Anna Dunlap  has presented today for surgery, with the diagnosis of GALLSTONES  The various methods of treatment have been discussed with the patient and family. After consideration of risks, benefits and other options for treatment, the patient has consented to  Procedure(s): LAPAROSCOPIC CHOLECYSTECTOMY (N/A) as a surgical intervention .  The patient's history has been reviewed, patient examined, no change in status, stable for surgery.  I have reviewed the patient's chart and labs.  Questions were answered to the patient's satisfaction.     Emelia LoronMatthew Delorse Shane

## 2018-01-12 NOTE — Discharge Instructions (Signed)
[facility] °LAPAROSCOPIC SURGERY: POST OP INSTRUCTIONS ° °Always review your discharge instruction sheet given to you by the facility where your surgery was performed. °IF YOU HAVE DISABILITY OR FAMILY LEAVE FORMS, YOU MUST BRING THEM TO THE OFFICE FOR PROCESSING.   °DO NOT GIVE THEM TO YOUR DOCTOR. ° °1. A prescription for pain medication may be given to you upon discharge.  Take your pain medication as prescribed, if needed.  If narcotic pain medicine is not needed, then you may take acetaminophen (Tylenol), naprosyn (Alleve), or ibuprofen (Advil) as needed.   °2. Take your usually prescribed medications unless otherwise directed. °3. If you need a refill on your pain medication, please contact your pharmacy.  They will contact our office to request authorization. Prescriptions will not be filled after 5pm or on week-ends. °4. You should follow a light diet the first few days after arrival home, such as soup and crackers, etc.  Be sure to include lots of fluids daily. °5. Most patients will experience some swelling and bruising in the area of the incisions.  Ice packs will help.  Swelling and bruising can take several days to resolve.  °6. It is common to experience some constipation if taking pain medication after surgery.  Increasing fluid intake and taking a stool softener (such as Colace) will usually help or prevent this problem from occurring.  A mild laxative (Milk of Magnesia or Miralax) should be taken according to package instructions if there are no bowel movements after 48 hours. °7. Unless discharge instructions indicate otherwise, you may remove your bandages 48 hours after surgery, and you may shower at that time.  You may have steri-strips (small skin tapes) in place directly over the incision.  These strips should be left on the skin for 7-10 days.  If your surgeon used skin glue on the incision, you may shower in 24 hours.  The glue will  flake off over the next 2-3 weeks.  Any sutures or staples will be removed at the office during your follow-up visit. °8. ACTIVITIES:  You may resume regular (light) daily activities beginning the next day--such as daily self-care, walking, climbing stairs--gradually increasing activities as tolerated.  You may have sexual intercourse when it is comfortable.  Refrain from any heavy lifting or straining until approved by your doctor. °a. You may drive when you are no longer taking prescription pain medication, you can comfortably wear a seatbelt, and you can safely maneuver your car and apply brakes. °b. RETURN TO WORK:  __________________________________________________________ °9. You should see your doctor in the office for a follow-up appointment approximately 2-3 weeks after your surgery.  Make sure that you call for this appointment within a day or two after you arrive home to insure a convenient appointment time. °10. OTHER INSTRUCTIONS: __________________________________________________________________________________________________________________________ __________________________________________________________________________________________________________________________ °WHEN TO CALL YOUR DOCTOR: °1. Fever over 101.0 °2. Inability to urinate °3. Continued bleeding from incision. °4. Increased pain, redness, or drainage from the incision. °5. Increasing abdominal pain ° °The clinic staff is available to answer your questions during regular business hours.  Please don’t hesitate to call and ask to speak to one of the nurses for clinical concerns.  If you have a medical emergency, go to the nearest emergency room or call 911.  A surgeon from [facility] is always on call at the hospital. °[address], [address], [place], [place]  [address] ? [address] [address], , [place]   [address] °[phone] ? [phone] ?   FAX [phone] °Web site: [email or web address] ° °Post  Anesthesia Home Care Instructions ° °Activity: °Get plenty of rest for the remainder of the day. A responsible individual must stay with you for 24 hours following the procedure.  °For the next 24 hours, DO NOT: °-Drive a car °-Operate machinery °-Drink alcoholic beverages °-Take any medication unless instructed by your physician °-Make any legal decisions or sign important papers. ° °Meals: °Start with liquid foods such as gelatin or soup. Progress to regular foods as tolerated. Avoid greasy, spicy, heavy foods. If nausea and/or vomiting occur, drink only clear liquids until the nausea and/or vomiting subsides. Call your physician if vomiting continues. ° °Special Instructions/Symptoms: °Your throat may feel dry or sore from the anesthesia or the breathing tube placed in your throat during surgery. If this causes discomfort, gargle with warm salt water. The discomfort should disappear within 24 hours. ° °If you had a scopolamine patch placed behind your ear for the management of post- operative nausea and/or vomiting: ° °1. The medication in the patch is effective for 72 hours, after which it should be removed.  Wrap patch in a tissue and discard in the trash. Wash hands thoroughly with soap and water. °2. You may remove the patch earlier than 72 hours if you experience unpleasant side effects which may include dry mouth, dizziness or visual disturbances. °3. Avoid touching the patch. Wash your hands with soap and water after contact with the patch. °  ° ° °

## 2018-01-12 NOTE — H&P (Signed)
67 yof who is otherwise healthy referred by Dr Andi Devon for gallstones. she has history of abdominoplasty. she was on a cruise in February and noted significant ruq pain associated with eating heavy meal. she had reflux with this. this continued on for a week or two and then eventually improved after she started a new diet cutting out many triggering foods. she has no n/v. no changes in bms. she has some llq with activity. she has csc in 2013 that was recommended follow up in ten years. she has normal labs and then underwent US with pcp that shows single gallstone. she is here to discuss options.  Past Surgical History Ethlyn Gallery, CMA; 12/15/2017 3:12 PM) Colon Polyp Removal - Colonoscopy  Foot Surgery  Right. Hysterectomy (not due to cancer) - Complete  Oral Surgery   Diagnostic Studies History Ethlyn Gallery, CMA; 12/15/2017 3:12 PM) Colonoscopy  5-10 years ago Mammogram  1-3 years ago Pap Smear  1-5 years ago  Allergies Ethlyn Gallery, CMA; 12/15/2017 3:15 PM) Lactose  Ampicillin *PENICILLINS*   Medication History (Alisha Spillers, CMA; 12/15/2017 3:17 PM) Pravastatin Sodium (40MG  Tablet, Oral) Active. Citracal + D (315-200MG -UNIT Tablet, Oral) Active. Citrucel (500MG  Tablet, Oral) Active. Ginkgo Biloba (40MG  Tablet, Oral) Active. Glucosamine Complex (Oral) Active. Medications Reconciled  Social History Ethlyn Gallery, CMA; 12/15/2017 3:12 PM) Alcohol use  Occasional alcohol use. Caffeine use  Coffee. No drug use  Tobacco use  Never smoker.  Family History Ethlyn Gallery, CMA; 12/15/2017 3:12 PM) Arthritis  Mother. Heart Disease  Mother. Heart disease in female family member before age 3  Hypertension  Brother, Mother. Respiratory Condition  Father. Seizure disorder  Son.  Pregnancy / Birth History Ethlyn Gallery, CMA; 12/15/2017 3:12 PM) Age at menarche  15 years. Age of menopause  80-59 Contraceptive History  Oral  contraceptives. Gravida  7 Maternal age  26-30 Para  6  Other Problems Ethlyn Gallery, CMA; 12/15/2017 3:12 PM) Back Pain  Cholelithiasis  Hypercholesterolemia  Oophorectomy   Review of Systems Elease Hashimoto Spillers CMA; 12/15/2017 3:12 PM) General Present- Fatigue. Not Present- Appetite Loss, Chills, Fever, Night Sweats, Weight Gain and Weight Loss. Skin Not Present- Change in Wart/Mole, Dryness, Hives, Jaundice, New Lesions, Non-Healing Wounds, Rash and Ulcer. HEENT Present- Seasonal Allergies and Wears glasses/contact lenses. Not Present- Earache, Hearing Loss, Hoarseness, Nose Bleed, Oral Ulcers, Ringing in the Ears, Sinus Pain, Sore Throat, Visual Disturbances and Yellow Eyes. Breast Not Present- Breast Mass, Breast Pain, Nipple Discharge and Skin Changes. Cardiovascular Present- Swelling of Extremities. Not Present- Chest Pain, Difficulty Breathing Lying Down, Leg Cramps, Palpitations, Rapid Heart Rate and Shortness of Breath. Gastrointestinal Present- Abdominal Pain and Bloating. Not Present- Bloody Stool, Change in Bowel Habits, Chronic diarrhea, Constipation, Difficulty Swallowing, Excessive gas, Gets full quickly at meals, Hemorrhoids, Indigestion, Nausea, Rectal Pain and Vomiting. Musculoskeletal Present- Back Pain. Not Present- Joint Pain, Joint Stiffness, Muscle Pain, Muscle Weakness and Swelling of Extremities. Neurological Not Present- Decreased Memory, Fainting, Headaches, Numbness, Seizures, Tingling, Tremor, Trouble walking and Weakness. Psychiatric Not Present- Anxiety, Bipolar, Change in Sleep Pattern, Depression, Fearful and Frequent crying. Endocrine Not Present- Cold Intolerance, Excessive Hunger, Hair Changes, Heat Intolerance, Hot flashes and New Diabetes. Hematology Not Present- Blood Thinners, Easy Bruising, Excessive bleeding, Gland problems, HIV and Persistent Infections.  Vitals (Alisha Spillers CMA; 12/15/2017 3:14 PM) 12/15/2017 3:13 PM Weight: 145 lb  Height: 64.25in Body Surface Area: 1.71 m Body Mass Index: 24.7 kg/m  Pulse: 78 (Regular)  BP: 116/62 (Sitting, Left Arm, Standard)  cv rrr  Lungs clear abd soft nontender  Assessment & Plan Emelia Loron(Selma Mink MD; 12/15/2017 4:07 PM) GALLSTONES (K80.20) Story: Laparoscopic cholecystectomy I think her symptoms are due to gallstones. I think reasonable to remove gallbladder. I discussed the procedure in detail. We discussed the risks and benefits of a laparoscopic cholecystectomy including, but not limited to bleeding, infection, injury to surrounding structures such as the intestine or liver, bile leak, retained gallstones, need to convert to an open procedure, prolonged diarrhea, blood clots such as DVT, common bile duct injury, anesthesia risks, and possible need for additional procedures. The likelihood of improvement in symptoms and return to the patient's normal status is good. We discussed the typical post-operative recovery course.

## 2018-01-12 NOTE — Transfer of Care (Signed)
Immediate Anesthesia Transfer of Care Note  Patient: Anna Dunlap  Procedure(s) Performed: LAPAROSCOPIC CHOLECYSTECTOMY (N/A Abdomen)  Patient Location: PACU  Anesthesia Type:General  Level of Consciousness: lethargic and responds to stimulation  Airway & Oxygen Therapy: Patient Spontanous Breathing and Patient connected to nasal cannula oxygen  Post-op Assessment: Report given to RN  Post vital signs: Reviewed and stable  Last Vitals:  Vitals Value Taken Time  BP    Temp    Pulse    Resp    SpO2      Last Pain:  Vitals:   01/12/18 0711  TempSrc: Oral  PainSc:          Complications: No apparent anesthesia complications

## 2018-01-13 ENCOUNTER — Encounter (HOSPITAL_COMMUNITY): Payer: Self-pay | Admitting: General Surgery

## 2018-07-12 ENCOUNTER — Other Ambulatory Visit: Payer: Self-pay | Admitting: Family Medicine

## 2018-07-12 DIAGNOSIS — Z1231 Encounter for screening mammogram for malignant neoplasm of breast: Secondary | ICD-10-CM

## 2018-07-30 ENCOUNTER — Ambulatory Visit
Admission: RE | Admit: 2018-07-30 | Discharge: 2018-07-30 | Disposition: A | Discharge status: Home / Self Care | Payer: Medicare HMO | Source: Ambulatory Visit | Attending: Family Medicine | Admitting: Family Medicine

## 2018-07-30 DIAGNOSIS — Z1231 Encounter for screening mammogram for malignant neoplasm of breast: Secondary | ICD-10-CM | POA: Insufficient documentation

## 2019-07-26 ENCOUNTER — Other Ambulatory Visit: Payer: Self-pay | Admitting: Family Medicine

## 2019-07-26 DIAGNOSIS — Z1231 Encounter for screening mammogram for malignant neoplasm of breast: Secondary | ICD-10-CM

## 2019-08-19 ENCOUNTER — Ambulatory Visit
Admission: RE | Admit: 2019-08-19 | Discharge: 2019-08-19 | Disposition: A | Discharge status: Home / Self Care | Payer: Medicare HMO | Source: Ambulatory Visit | Attending: Family Medicine | Admitting: Family Medicine

## 2019-08-19 DIAGNOSIS — Z1231 Encounter for screening mammogram for malignant neoplasm of breast: Secondary | ICD-10-CM

## 2020-07-11 ENCOUNTER — Other Ambulatory Visit: Payer: Self-pay | Admitting: Family Medicine

## 2020-07-11 DIAGNOSIS — Z1231 Encounter for screening mammogram for malignant neoplasm of breast: Secondary | ICD-10-CM

## 2020-07-23 ENCOUNTER — Other Ambulatory Visit: Payer: Self-pay

## 2020-07-23 ENCOUNTER — Other Ambulatory Visit: Payer: Medicare HMO

## 2020-07-23 DIAGNOSIS — Z20822 Contact with and (suspected) exposure to covid-19: Secondary | ICD-10-CM

## 2020-07-24 LAB — SARS-COV-2, NAA 2 DAY TAT

## 2020-07-24 LAB — NOVEL CORONAVIRUS, NAA: SARS-CoV-2, NAA: NOT DETECTED

## 2020-08-20 ENCOUNTER — Ambulatory Visit
Admission: RE | Admit: 2020-08-20 | Discharge: 2020-08-20 | Disposition: A | Discharge status: Home / Self Care | Payer: Medicare HMO | Source: Ambulatory Visit | Attending: Family Medicine | Admitting: Family Medicine

## 2020-08-20 ENCOUNTER — Other Ambulatory Visit: Payer: Self-pay

## 2020-08-20 DIAGNOSIS — Z1231 Encounter for screening mammogram for malignant neoplasm of breast: Secondary | ICD-10-CM | POA: Diagnosis not present

## 2020-11-29 IMAGING — MG DIGITAL SCREENING BILAT W/ TOMO W/ CAD
8 series · 8 of 24 positions shown · non-contrast
Comparison: Previous exam(s).

CLINICAL DATA: Screening.

EXAM:
DIGITAL SCREENING BILATERAL MAMMOGRAM WITH TOMO AND CAD

[L MLO synth-2D]
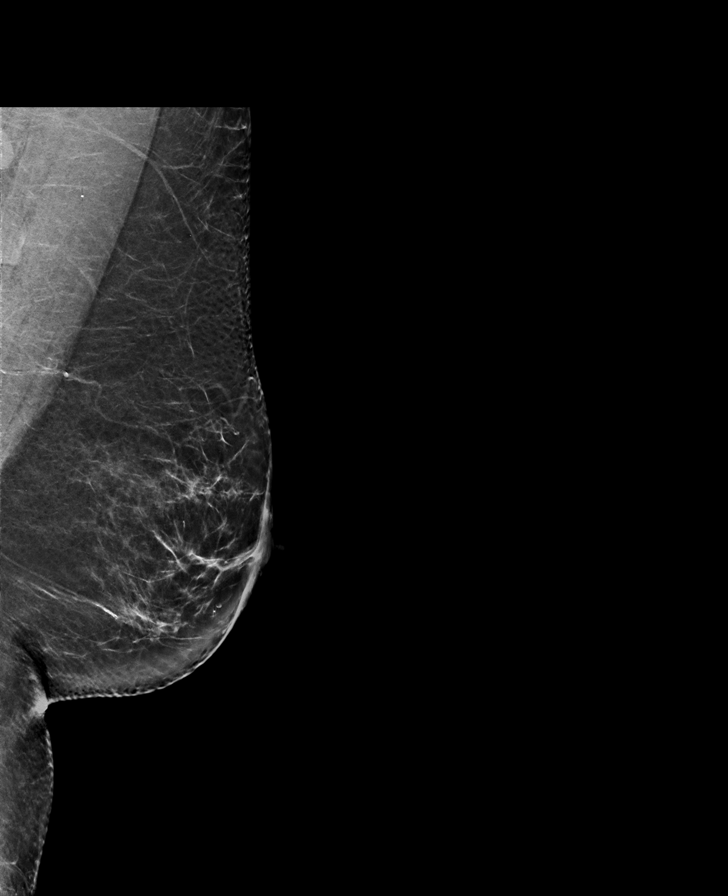

[R CC synth-2D]
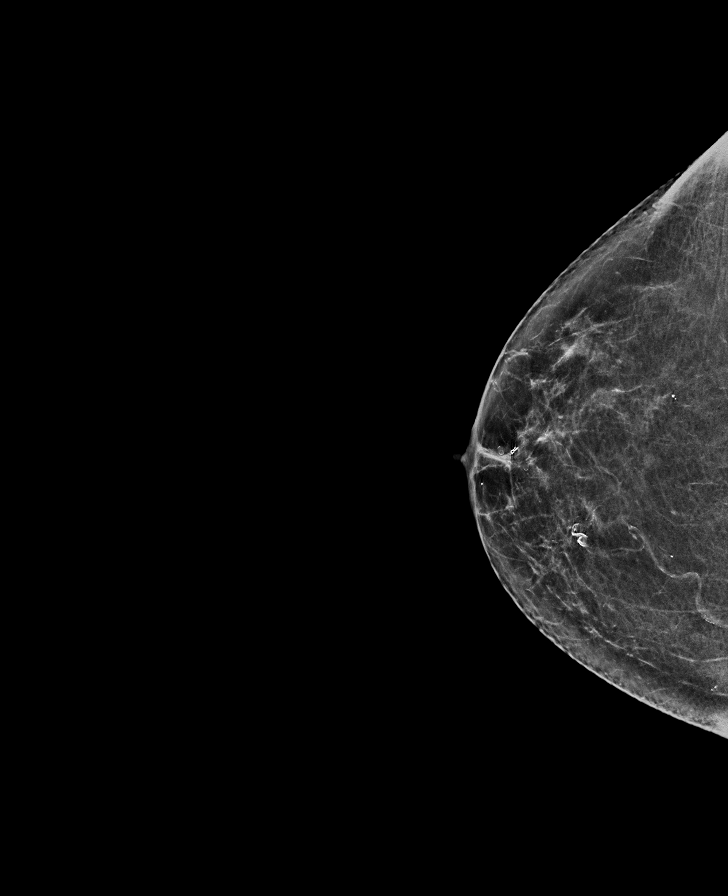

[L CC synth-2D]
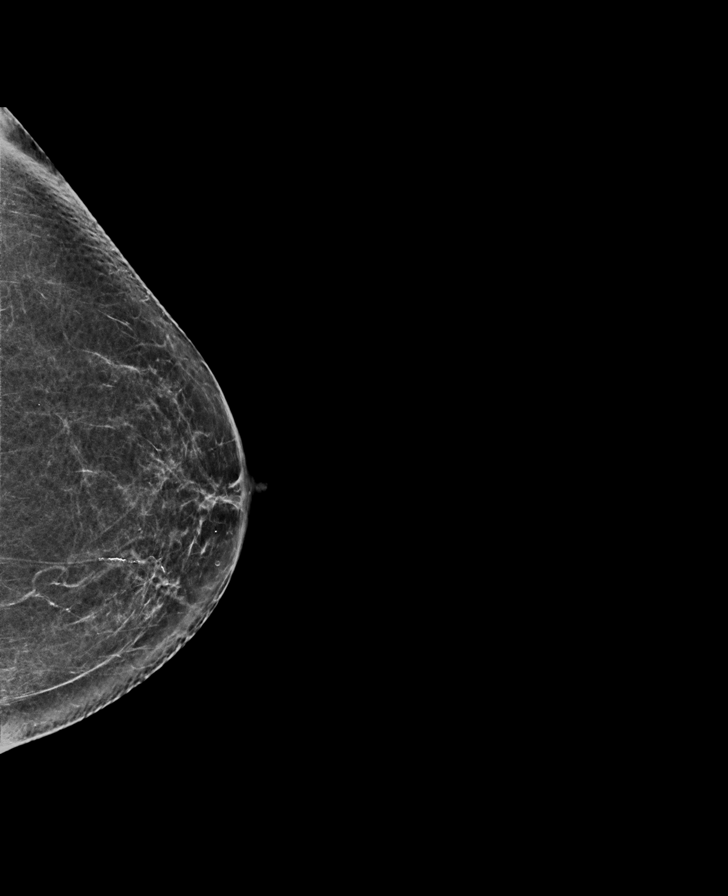

[R MLO synth-2D]
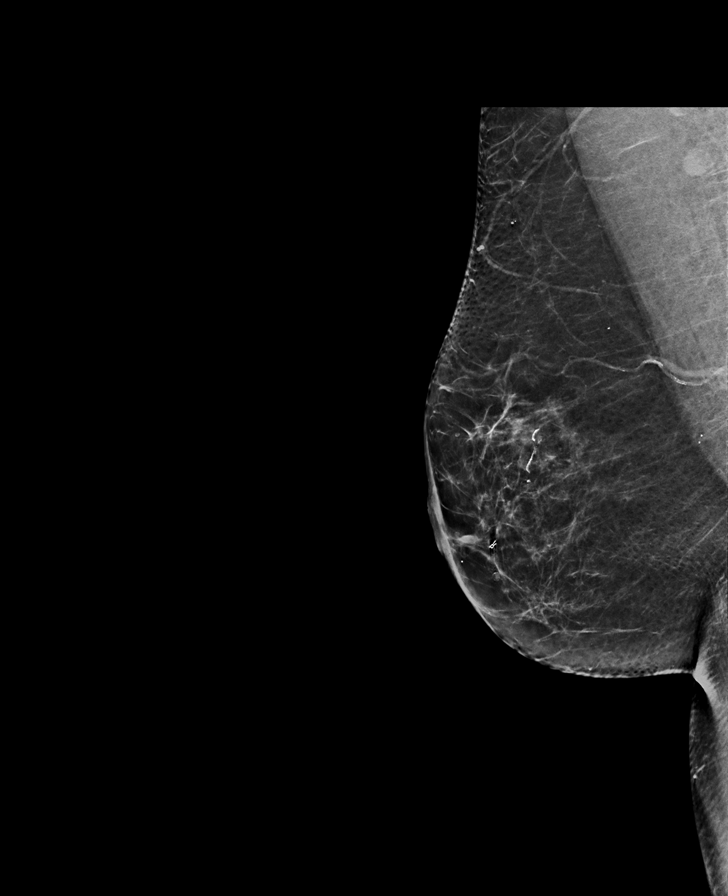

[R CC tomo · tomo slice 33/65.0]
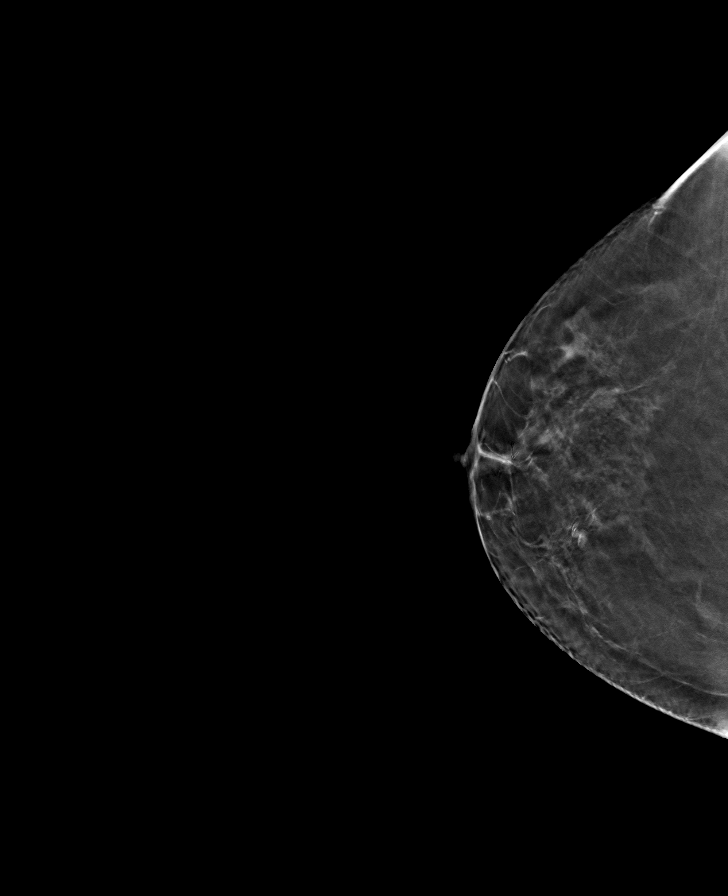

[L MLO tomo · tomo slice 35/70.0]
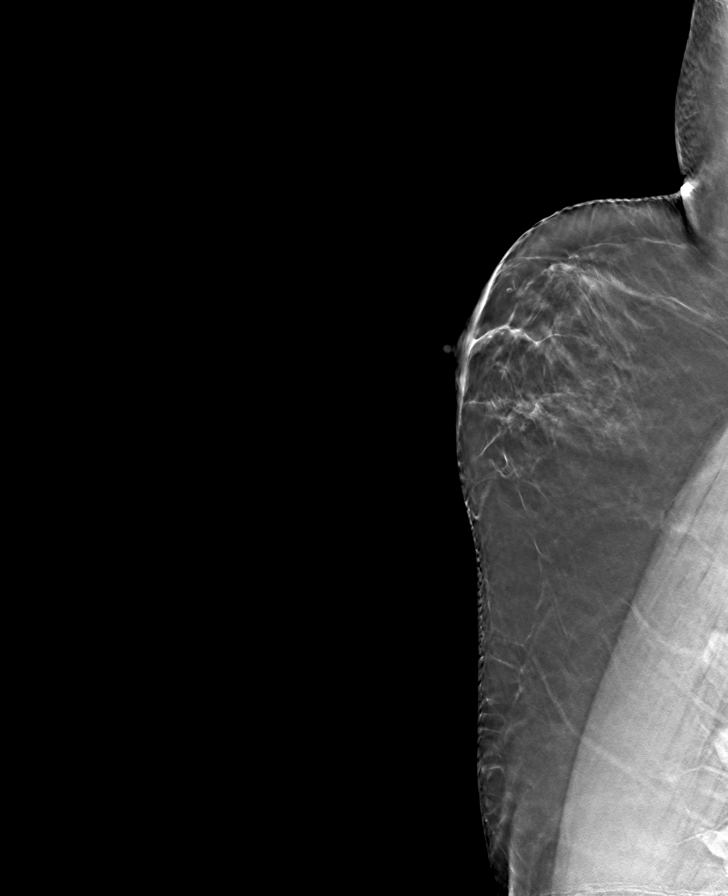

[R MLO tomo · tomo slice 35/69.0]
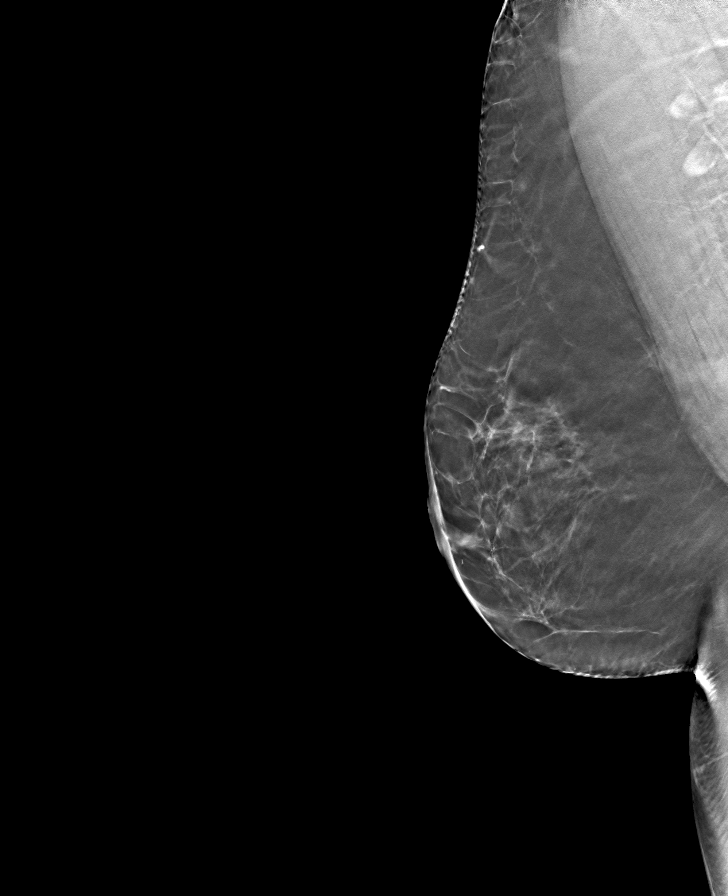

[L CC tomo · tomo slice 32/63.0]
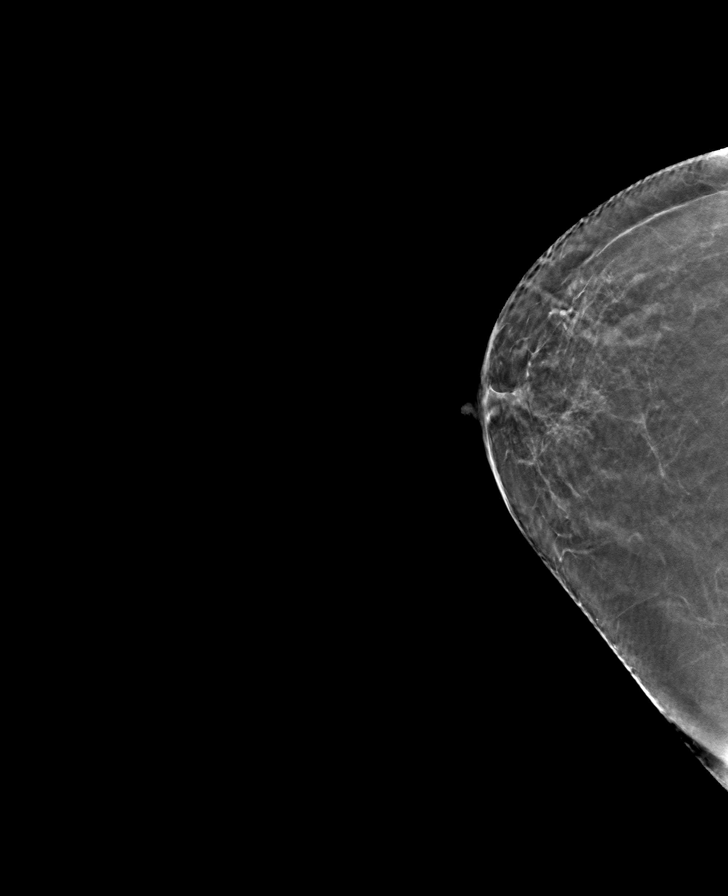

[8 of 24 positions shown; findings below may reference images not displayed]

ACR Breast Density Category b: There are scattered areas of
fibroglandular density.
FINDINGS: There are no findings suspicious for malignancy. Images were
processed with CAD.
IMPRESSION: No mammographic evidence of malignancy. A result letter of this
screening mammogram will be mailed directly to the patient.

RECOMMENDATION:
Screening mammogram in one year. (Code:[id number])

BI-RADS CATEGORY  1: Negative.

## 2021-10-21 ENCOUNTER — Other Ambulatory Visit: Payer: Self-pay | Admitting: Family Medicine

## 2021-10-21 DIAGNOSIS — Z1231 Encounter for screening mammogram for malignant neoplasm of breast: Secondary | ICD-10-CM

## 2021-11-22 ENCOUNTER — Ambulatory Visit
Admission: RE | Admit: 2021-11-22 | Discharge: 2021-11-22 | Disposition: A | Discharge status: Home / Self Care | Payer: Medicare HMO | Source: Ambulatory Visit | Attending: Family Medicine | Admitting: Family Medicine

## 2021-11-22 ENCOUNTER — Other Ambulatory Visit: Payer: Self-pay

## 2021-11-22 DIAGNOSIS — Z1231 Encounter for screening mammogram for malignant neoplasm of breast: Secondary | ICD-10-CM | POA: Diagnosis present

## 2022-10-09 ENCOUNTER — Other Ambulatory Visit: Payer: Self-pay | Admitting: Family Medicine

## 2022-10-09 DIAGNOSIS — Z1231 Encounter for screening mammogram for malignant neoplasm of breast: Secondary | ICD-10-CM

## 2022-11-24 ENCOUNTER — Ambulatory Visit
Admission: RE | Admit: 2022-11-24 | Discharge: 2022-11-24 | Disposition: A | Discharge status: Home / Self Care | Payer: Medicare HMO | Source: Ambulatory Visit | Attending: Family Medicine | Admitting: Family Medicine

## 2022-11-24 DIAGNOSIS — Z1231 Encounter for screening mammogram for malignant neoplasm of breast: Secondary | ICD-10-CM | POA: Insufficient documentation

## 2023-03-09 ENCOUNTER — Encounter: Payer: Self-pay | Admitting: Emergency Medicine

## 2023-03-16 ENCOUNTER — Ambulatory Visit
Admission: RE | Admit: 2023-03-16 | Discharge: 2023-03-16 | Disposition: A | Discharge status: Home / Self Care | Payer: Medicare HMO | Attending: Gastroenterology | Admitting: Gastroenterology

## 2023-03-16 ENCOUNTER — Ambulatory Visit: Payer: Medicare HMO | Admitting: Anesthesiology

## 2023-03-16 ENCOUNTER — Encounter
Admission: RE | Disposition: A | Discharge status: Home / Self Care | Payer: Self-pay | Source: Home / Self Care | Attending: Gastroenterology

## 2023-03-16 DIAGNOSIS — E039 Hypothyroidism, unspecified: Secondary | ICD-10-CM | POA: Insufficient documentation

## 2023-03-16 DIAGNOSIS — K219 Gastro-esophageal reflux disease without esophagitis: Secondary | ICD-10-CM | POA: Insufficient documentation

## 2023-03-16 DIAGNOSIS — K635 Polyp of colon: Secondary | ICD-10-CM | POA: Diagnosis not present

## 2023-03-16 DIAGNOSIS — Z1211 Encounter for screening for malignant neoplasm of colon: Secondary | ICD-10-CM | POA: Diagnosis present

## 2023-03-16 DIAGNOSIS — Z79899 Other long term (current) drug therapy: Secondary | ICD-10-CM | POA: Insufficient documentation

## 2023-03-16 DIAGNOSIS — E785 Hyperlipidemia, unspecified: Secondary | ICD-10-CM | POA: Diagnosis not present

## 2023-03-16 DIAGNOSIS — E119 Type 2 diabetes mellitus without complications: Secondary | ICD-10-CM | POA: Insufficient documentation

## 2023-03-16 DIAGNOSIS — K640 First degree hemorrhoids: Secondary | ICD-10-CM | POA: Insufficient documentation

## 2023-03-16 HISTORY — PX: HEMOSTASIS CLIP PLACEMENT: SHX6857

## 2023-03-16 HISTORY — PX: COLONOSCOPY WITH PROPOFOL: SHX5780

## 2023-03-16 HISTORY — PX: POLYPECTOMY: SHX5525

## 2023-03-16 HISTORY — PX: SUBMUCOSAL INJECTION: SHX5543

## 2023-03-16 SURGERY — COLONOSCOPY WITH PROPOFOL
Anesthesia: General

## 2023-03-16 MED ORDER — PROPOFOL 500 MG/50ML IV EMUL
INTRAVENOUS | Status: DC | PRN
  Administered 2023-03-16: 100 ug/kg/min via INTRAVENOUS

## 2023-03-16 MED ORDER — LIDOCAINE HCL (CARDIAC) PF 100 MG/5ML IV SOSY
PREFILLED_SYRINGE | INTRAVENOUS | Status: DC | PRN
  Administered 2023-03-16: 50 mg via INTRAVENOUS

## 2023-03-16 MED ORDER — LIDOCAINE HCL (PF) 2 % IJ SOLN
INTRAMUSCULAR | Status: AC
  Filled 2023-03-16: qty 5

## 2023-03-16 MED ORDER — PROPOFOL 10 MG/ML IV BOLUS
INTRAVENOUS | Status: DC | PRN
  Administered 2023-03-16: 70 mg via INTRAVENOUS
  Administered 2023-03-16: 20 mg via INTRAVENOUS

## 2023-03-16 MED ORDER — SODIUM CHLORIDE 0.9 % IV SOLN
INTRAVENOUS | Status: DC
  Administered 2023-03-16: 20 mL/h via INTRAVENOUS

## 2023-03-16 NOTE — Interval H&P Note (Signed)
History and Physical Interval Note:  03/16/2023 8:43 AM  Anna Dunlap  has presented today for surgery, with the diagnosis of CCA SCREEN.  The various methods of treatment have been discussed with the patient and family. After consideration of risks, benefits and other options for treatment, the patient has consented to  Procedure(s): COLONOSCOPY WITH PROPOFOL (N/A) as a surgical intervention.  The patient's history has been reviewed, patient examined, no change in status, stable for surgery.  I have reviewed the patient's chart and labs.  Questions were answered to the patient's satisfaction.     Regis Bill  Ok to proceed with colonoscopy

## 2023-03-16 NOTE — Anesthesia Preprocedure Evaluation (Addendum)
Anesthesia Evaluation  Patient identified by MRN, date of birth, ID band Patient awake    Reviewed: Allergy & Precautions, NPO status , Patient's Chart, lab work & pertinent test results  History of Anesthesia Complications Negative for: history of anesthetic complications  Airway Mallampati: IV   Neck ROM: Full    Dental no notable dental hx.    Pulmonary neg pulmonary ROS   Pulmonary exam normal breath sounds clear to auscultation       Cardiovascular Exercise Tolerance: Good negative cardio ROS Normal cardiovascular exam Rhythm:Regular Rate:Normal     Neuro/Psych negative neurological ROS     GI/Hepatic ,GERD  ,,  Endo/Other  diabetes, Type 2Hypothyroidism    Renal/GU negative Renal ROS     Musculoskeletal   Abdominal   Peds  Hematology negative hematology ROS (+)   Anesthesia Other Findings   Reproductive/Obstetrics                             Anesthesia Physical Anesthesia Plan  ASA: 2  Anesthesia Plan: General   Post-op Pain Management:    Induction: Intravenous  PONV Risk Score and Plan: 3 and Propofol infusion, TIVA and Treatment may vary due to age or medical condition  Airway Management Planned: Natural Airway  Additional Equipment:   Intra-op Plan:   Post-operative Plan:   Informed Consent: I have reviewed the patients History and Physical, chart, labs and discussed the procedure including the risks, benefits and alternatives for the proposed anesthesia with the patient or authorized representative who has indicated his/her understanding and acceptance.       Plan Discussed with: CRNA  Anesthesia Plan Comments: (LMA/GETA backup discussed.  Patient consented for risks of anesthesia including but not limited to:  - adverse reactions to medications - damage to eyes, teeth, lips or other oral mucosa - nerve damage due to positioning  - sore throat or  hoarseness - damage to heart, brain, nerves, lungs, other parts of body or loss of life  Informed patient about role of CRNA in peri- and intra-operative care.  Patient voiced understanding.)        Anesthesia Quick Evaluation

## 2023-03-16 NOTE — Transfer of Care (Signed)
Immediate Anesthesia Transfer of Care Note  Patient: Anna Dunlap  Procedure(s) Performed: COLONOSCOPY WITH PROPOFOL  Patient Location: PACU and Endoscopy Unit  Anesthesia Type:General  Level of Consciousness: awake and patient cooperative  Airway & Oxygen Therapy: Patient Spontanous Breathing  Post-op Assessment: Report given to RN and Post -op Vital signs reviewed and stable  Post vital signs: Reviewed and stable  Last Vitals:  Vitals Value Taken Time  BP 87/50 03/16/23 0918  Temp    Pulse 61 03/16/23 0918  Resp 17 03/16/23 0918  SpO2 96 % 03/16/23 0918  Vitals shown include unvalidated device data.  Last Pain:  Vitals:   03/16/23 0819  TempSrc: Temporal  PainSc: 2          Complications: No notable events documented.

## 2023-03-16 NOTE — Anesthesia Postprocedure Evaluation (Signed)
Anesthesia Post Note  Patient: Anna Dunlap  Procedure(s) Performed: COLONOSCOPY WITH PROPOFOL  Patient location during evaluation: PACU Anesthesia Type: General Level of consciousness: awake and alert, oriented and patient cooperative Pain management: pain level controlled Vital Signs Assessment: post-procedure vital signs reviewed and stable Respiratory status: spontaneous breathing, nonlabored ventilation and respiratory function stable Cardiovascular status: blood pressure returned to baseline and stable Postop Assessment: adequate PO intake Anesthetic complications: no   No notable events documented.   Last Vitals:  Vitals:   03/16/23 0931 03/16/23 0941  BP: 102/66 107/69  Pulse: 60 60  Resp: 17 15  Temp:    SpO2: 96% 100%    Last Pain:  Vitals:   03/16/23 0931  TempSrc:   PainSc: 0-No pain                 Reed Breech

## 2023-03-16 NOTE — H&P (Signed)
Outpatient short stay form Pre-procedure 03/16/2023  Anna Bill, MD  Primary Physician: Kandyce Rud, MD  Reason for visit:  Screening  History of present illness:    72 y/o lady with history of GERD and HLD here for screening colonoscopy. Last colonoscopy in 2013 was reportedly unremarkable. No blood thinners. History of cholecystectomy, hysterectomy, and abdominoplasty. No family history of GI malignancies.    Current Facility-Administered Medications:    0.9 %  sodium chloride infusion, , Intravenous, Continuous, Limuel Nieblas, Rossie Muskrat, MD, Last Rate: 20 mL/hr at 03/16/23 0828, 20 mL/hr at 03/16/23 1308  Medications Prior to Admission  Medication Sig Dispense Refill Last Dose   b complex vitamins tablet Take 1 tablet daily by mouth.   Past Week   Calcium Citrate-Vitamin D (CITRACAL PETITES/VITAMIN D PO) Take 2 tablets by mouth 2 (two) times daily.   Past Week   Fe Bisgly-Vit C-Vit B12-FA (GENTLE IRON PO) Take 25 mg by mouth daily.    Past Week   FIBER PO Take 5 capsules by mouth at bedtime.   Past Week   Ginkgo Biloba 60 MG TABS Take 60 mg by mouth daily.   Past Week   ibuprofen (ADVIL,MOTRIN) 200 MG tablet Take 200 mg by mouth 2 (two) times daily as needed for headache or moderate pain.   Past Week   levocetirizine (XYZAL) 5 MG tablet Take 5 mg by mouth at bedtime.   Past Week   Lutein 40 MG CAPS Take 40 mg by mouth at bedtime.    Past Week   Misc Natural Products (GLUCOSAMINE CHONDROITIN TRIPLE) TABS Take 2 tablets by mouth daily.   Past Week   pravastatin (PRAVACHOL) 40 MG tablet Take 40 mg by mouth at bedtime.    03/15/2023   Simethicone (GAS-X PO) Take 1 capsule by mouth daily as needed (gas).   Past Week   Sodium Chloride-Sodium Bicarb (NETI POT SINUS WASH NA) Place 1 Dose into the nose daily.   Past Week   triamcinolone cream (KENALOG) 0.1 % Apply 1 application topically 2 (two) times daily as needed (irritation).    Past Week     Allergies  Allergen Reactions    Lactose Intolerance (Gi) Diarrhea   Ampicillin Rash    Has patient had a PCN reaction causing immediate rash, facial/tongue/throat swelling, SOB or lightheadedness with hypotension: Yes Has patient had a PCN reaction causing severe rash involving mucus membranes or skin necrosis: No Has patient had a PCN reaction that required hospitalization: No Has patient had a PCN reaction occurring within the last 10 years: No If all of the above answers are "NO", then may proceed with Cephalosporin use.      Past Medical History:  Diagnosis Date   Hyperlipidemia    Wears contact lenses     Review of systems:  Otherwise negative.    Physical Exam  Gen: Alert, oriented. Appears stated age.  HEENT: PERRLA. Lungs: No respiratory distress CV: RRR Abd: soft, benign, no masses Ext: No edema    Planned procedures: Proceed with colonoscopy. The patient understands the nature of the planned procedure, indications, risks, alternatives and potential complications including but not limited to bleeding, infection, perforation, damage to internal organs and possible oversedation/side effects from anesthesia. The patient agrees and gives consent to proceed.  Please refer to procedure notes for findings, recommendations and patient disposition/instructions.     Anna Bill, MD [facility]

## 2023-03-16 NOTE — Op Note (Signed)
[facility] Patient Name: Anna Dunlap Procedure Date: 03/16/2023 8:39 AM MRN: [id number] Account #: [id number] Date of Birth: [birth year] Admit Type: Outpatient Age: 72 Room: [facility] ENDO ROOM 3 Gender: Female Note Status: Finalized Instrument Name: Nelda Marseille [id number] Procedure:             Colonoscopy Indications:           Screening for colorectal malignant neoplasm Providers:             Eather Colas MD, MD Referring MD:          Hassell Halim MD (Referring MD) Medicines:             Monitored Anesthesia Care Complications:         No immediate complications. Estimated blood loss:                         Minimal. Procedure:             Pre-Anesthesia Assessment:                        - Prior to the procedure, a History and Physical was                         performed, and patient medications and allergies were                         reviewed. The patient is competent. The risks and                         benefits of the procedure and the sedation options and                         risks were discussed with the patient. All questions                         were answered and informed consent was obtained.                         Patient identification and proposed procedure were                         verified by the physician, the nurse, the                         anesthesiologist, the anesthetist and the technician                         in the endoscopy suite. Mental Status Examination:                         alert and oriented. Airway Examination: normal                         oropharyngeal airway and neck mobility. Respiratory                         Examination: clear to auscultation. CV Examination:  normal. Prophylactic Antibiotics: The patient does not                         require prophylactic antibiotics. Prior                         Anticoagulants: The patient has taken no anticoagulant                          or antiplatelet agents. ASA Grade Assessment: II - A                         patient with mild systemic disease. After reviewing                         the risks and benefits, the patient was deemed in                         satisfactory condition to undergo the procedure. The                         anesthesia plan was to use monitored anesthesia care                         (MAC). Immediately prior to administration of                         medications, the patient was re-assessed for adequacy                         to receive sedatives. The heart rate, respiratory                         rate, oxygen saturations, blood pressure, adequacy of                         pulmonary ventilation, and response to care were                         monitored throughout the procedure. The physical                         status of the patient was re-assessed after the                         procedure.                        After obtaining informed consent, the colonoscope was                         passed under direct vision. Throughout the procedure,                         the patient's blood pressure, pulse, and oxygen                         saturations were monitored continuously. The  Colonoscope was introduced through the anus and                         advanced to the the terminal ileum. The colonoscopy                         was somewhat difficult due to a redundant colon.                         Successful completion of the procedure was aided by                         applying abdominal pressure. The patient tolerated the                         procedure well. The quality of the bowel preparation                         was good. The terminal ileum, ileocecal valve,                         appendiceal orifice, and rectum were photographed. Findings:      The perianal and digital rectal examinations were normal.      The terminal ileum  appeared normal.      A 12 mm polyp was found in the proximal transverse colon. The polyp was       sessile. Preparations were made for mucosal resection. Demarcation of       the lesion was performed with narrow band imaging to clearly identify       the boundaries of the lesion. Eleview was injected to raise the lesion.       Snare mucosal resection was performed. Resection and retrieval were       complete. Resected tissue margins were examined and clear of polyp       tissue. To prevent bleeding post-intervention, one hemostatic clip was       successfully placed. There was no bleeding during, or at the end, of the       procedure.      A 3 mm polyp was found in the distal transverse colon. The polyp was       sessile. The polyp was removed with a cold snare. Resection and       retrieval were complete. Estimated blood loss was minimal.      Internal hemorrhoids were found during retroflexion. The hemorrhoids       were Grade I (internal hemorrhoids that do not prolapse).      The exam was otherwise without abnormality on direct and retroflexion       views. Impression:            - The examined portion of the ileum was normal.                        - One 12 mm polyp in the proximal transverse colon,                         removed with mucosal resection. Resected and  retrieved. Clip was placed.                        - One 3 mm polyp in the distal transverse colon,                         removed with a cold snare. Resected and retrieved.                        - Internal hemorrhoids.                        - The examination was otherwise normal on direct and                         retroflexion views.                        - Mucosal resection was performed. Resection and                         retrieval were complete. Recommendation:        - Discharge patient to home.                        - Resume previous diet.                        - Continue  present medications.                        - Await pathology results.                        - Repeat colonoscopy in 3 years for surveillance.                        - Return to referring physician as previously                         scheduled. Procedure Code(s):     --- Professional ---                        [phone], Colonoscopy, flexible; with endoscopic mucosal                         resection                        [id number], 59, Colonoscopy, flexible; with removal of                         tumor(s), polyp(s), or other lesion(s) by snare                         technique Diagnosis Code(s):     --- Professional ---                        Z12.11, Encounter for screening for malignant neoplasm  of colon                        K64.0, First degree hemorrhoids                        D12.3, Benign neoplasm of transverse colon (hepatic                         flexure or splenic flexure) CPT copyright 2022 [employer]. All rights reserved. The codes documented in this report are preliminary and upon coder review may  be revised to meet current compliance requirements. Eather Colas MD, MD 03/16/2023 9:20:08 AM Number of Addenda: 0 Note Initiated On: 03/16/2023 8:39 AM Scope Withdrawal Time: 0 hours 14 minutes 32 seconds  Total Procedure Duration: 0 hours 22 minutes 20 seconds  Estimated Blood Loss:  Estimated blood loss was minimal.      [facility]

## 2023-03-17 ENCOUNTER — Encounter: Payer: Self-pay | Admitting: Gastroenterology

## 2023-03-19 ENCOUNTER — Encounter: Payer: Self-pay | Admitting: Gastroenterology

## 2023-06-25 ENCOUNTER — Ambulatory Visit: Payer: Medicare HMO

## 2023-06-25 DIAGNOSIS — K3189 Other diseases of stomach and duodenum: Secondary | ICD-10-CM | POA: Diagnosis present

## 2023-06-25 DIAGNOSIS — K2100 Gastro-esophageal reflux disease with esophagitis, without bleeding: Secondary | ICD-10-CM | POA: Diagnosis not present

## 2023-06-25 DIAGNOSIS — K449 Diaphragmatic hernia without obstruction or gangrene: Secondary | ICD-10-CM | POA: Diagnosis not present

## 2023-07-31 ENCOUNTER — Other Ambulatory Visit: Payer: Self-pay | Admitting: Medical Genetics

## 2023-07-31 DIAGNOSIS — Z006 Encounter for examination for normal comparison and control in clinical research program: Secondary | ICD-10-CM

## 2023-08-11 ENCOUNTER — Other Ambulatory Visit
Admission: RE | Admit: 2023-08-11 | Discharge: 2023-08-11 | Disposition: A | Discharge status: Home / Self Care | Payer: Self-pay | Source: Ambulatory Visit | Attending: Medical Genetics | Admitting: Medical Genetics

## 2023-08-11 DIAGNOSIS — Z006 Encounter for examination for normal comparison and control in clinical research program: Secondary | ICD-10-CM | POA: Insufficient documentation

## 2023-08-22 LAB — HELIX MOLECULAR SCREEN: Genetic Analysis Overall Interpretation: NEGATIVE

## 2023-08-22 LAB — GENECONNECT MOLECULAR SCREEN

## 2023-12-25 ENCOUNTER — Other Ambulatory Visit: Payer: Self-pay | Admitting: Family Medicine

## 2023-12-25 DIAGNOSIS — Z1231 Encounter for screening mammogram for malignant neoplasm of breast: Secondary | ICD-10-CM

## 2024-01-07 ENCOUNTER — Ambulatory Visit
Admission: RE | Admit: 2024-01-07 | Discharge: 2024-01-07 | Disposition: A | Discharge status: Home / Self Care | Source: Ambulatory Visit | Attending: Family Medicine | Admitting: Family Medicine

## 2024-01-07 DIAGNOSIS — Z1231 Encounter for screening mammogram for malignant neoplasm of breast: Secondary | ICD-10-CM | POA: Diagnosis present
